# Patient Record
Sex: Male | Born: 1935 | Race: White | Hispanic: No | Marital: Married | Smoking: Current every day smoker
Health system: Southern US, Community
[De-identification: ages and names within clinical notes are randomized; demographics above are authoritative.]

## PROBLEM LIST (undated history)

## (undated) DIAGNOSIS — E785 Hyperlipidemia, unspecified: Secondary | ICD-10-CM

## (undated) DIAGNOSIS — C801 Malignant (primary) neoplasm, unspecified: Secondary | ICD-10-CM

## (undated) DIAGNOSIS — G912 (Idiopathic) normal pressure hydrocephalus: Secondary | ICD-10-CM

## (undated) DIAGNOSIS — G919 Hydrocephalus, unspecified: Secondary | ICD-10-CM

## (undated) DIAGNOSIS — N183 Chronic kidney disease, stage 3 unspecified: Secondary | ICD-10-CM

## (undated) DIAGNOSIS — R269 Unspecified abnormalities of gait and mobility: Secondary | ICD-10-CM

## (undated) DIAGNOSIS — R413 Other amnesia: Secondary | ICD-10-CM

## (undated) DIAGNOSIS — G43909 Migraine, unspecified, not intractable, without status migrainosus: Secondary | ICD-10-CM

## (undated) DIAGNOSIS — W19XXXA Unspecified fall, initial encounter: Secondary | ICD-10-CM

## (undated) DIAGNOSIS — G629 Polyneuropathy, unspecified: Secondary | ICD-10-CM

## (undated) DIAGNOSIS — I4719 Other supraventricular tachycardia: Secondary | ICD-10-CM

## (undated) DIAGNOSIS — R296 Repeated falls: Secondary | ICD-10-CM

## (undated) DIAGNOSIS — E039 Hypothyroidism, unspecified: Secondary | ICD-10-CM

## (undated) DIAGNOSIS — I471 Supraventricular tachycardia: Secondary | ICD-10-CM

## (undated) DIAGNOSIS — F0390 Unspecified dementia without behavioral disturbance: Secondary | ICD-10-CM

## (undated) DIAGNOSIS — I10 Essential (primary) hypertension: Secondary | ICD-10-CM

## (undated) DIAGNOSIS — Z8679 Personal history of other diseases of the circulatory system: Secondary | ICD-10-CM

## (undated) HISTORY — PX: CYSTOSTOMY W/ BLADDER BIOPSY: SHX1431

## (undated) HISTORY — PX: SHOULDER SURGERY: SHX246

## (undated) HISTORY — DX: (Idiopathic) normal pressure hydrocephalus: G91.2

## (undated) HISTORY — PX: CAROTID ARTERY ANGIOPLASTY: SHX1300

## (undated) HISTORY — DX: Migraine, unspecified, not intractable, without status migrainosus: G43.909

## (undated) HISTORY — DX: Hypothyroidism, unspecified: E03.9

## (undated) HISTORY — DX: Other amnesia: R41.3

## (undated) HISTORY — PX: CAROTID ENDARTERECTOMY: SUR193

## (undated) HISTORY — DX: Personal history of other diseases of the circulatory system: Z86.79

## (undated) HISTORY — DX: Hyperlipidemia, unspecified: E78.5

## (undated) HISTORY — PX: CATARACT EXTRACTION: SUR2

## (undated) HISTORY — PX: VENTRICULOPERITONEAL SHUNT: SHX204

## (undated) HISTORY — DX: Unspecified abnormalities of gait and mobility: R26.9

---

## 1998-01-08 ENCOUNTER — Encounter: Admission: RE | Admit: 1998-01-08 | Discharge: 1998-01-17 | Payer: Self-pay | Admitting: Neurology

## 1999-08-21 ENCOUNTER — Ambulatory Visit (HOSPITAL_COMMUNITY): Admission: RE | Admit: 1999-08-21 | Discharge: 1999-08-21 | Payer: Self-pay | Admitting: Family Medicine

## 1999-10-03 ENCOUNTER — Encounter: Payer: Self-pay | Admitting: *Deleted

## 1999-10-10 ENCOUNTER — Ambulatory Visit (HOSPITAL_COMMUNITY): Admission: RE | Admit: 1999-10-10 | Discharge: 1999-10-11 | Payer: Self-pay | Admitting: *Deleted

## 2000-05-08 ENCOUNTER — Encounter (INDEPENDENT_AMBULATORY_CARE_PROVIDER_SITE_OTHER): Payer: Self-pay | Admitting: *Deleted

## 2000-05-08 ENCOUNTER — Inpatient Hospital Stay (HOSPITAL_COMMUNITY): Admission: RE | Admit: 2000-05-08 | Discharge: 2000-05-09 | Payer: Self-pay | Admitting: *Deleted

## 2000-05-09 ENCOUNTER — Encounter: Payer: Self-pay | Admitting: *Deleted

## 2000-06-15 ENCOUNTER — Other Ambulatory Visit: Admission: RE | Admit: 2000-06-15 | Discharge: 2000-06-15 | Payer: Self-pay | Admitting: Urology

## 2000-06-15 ENCOUNTER — Encounter (INDEPENDENT_AMBULATORY_CARE_PROVIDER_SITE_OTHER): Payer: Self-pay | Admitting: Specialist

## 2000-06-18 ENCOUNTER — Ambulatory Visit: Admission: RE | Admit: 2000-06-18 | Discharge: 2000-09-16 | Payer: Self-pay | Admitting: Radiation Oncology

## 2000-07-14 ENCOUNTER — Ambulatory Visit (HOSPITAL_COMMUNITY): Admission: RE | Admit: 2000-07-14 | Discharge: 2000-07-14 | Payer: Self-pay | Admitting: Neurology

## 2000-07-22 ENCOUNTER — Encounter: Payer: Self-pay | Admitting: Family Medicine

## 2000-07-22 ENCOUNTER — Encounter: Admission: RE | Admit: 2000-07-22 | Discharge: 2000-07-22 | Payer: Self-pay | Admitting: Family Medicine

## 2000-09-18 ENCOUNTER — Ambulatory Visit: Admission: RE | Admit: 2000-09-18 | Discharge: 2000-12-17 | Payer: Self-pay | Admitting: Urology

## 2001-08-06 ENCOUNTER — Encounter: Payer: Self-pay | Admitting: Neurology

## 2001-08-06 ENCOUNTER — Ambulatory Visit (HOSPITAL_COMMUNITY): Admission: RE | Admit: 2001-08-06 | Discharge: 2001-08-06 | Payer: Self-pay | Admitting: Neurology

## 2001-11-29 ENCOUNTER — Ambulatory Visit (HOSPITAL_COMMUNITY): Admission: RE | Admit: 2001-11-29 | Discharge: 2001-11-29 | Payer: Self-pay | Admitting: *Deleted

## 2001-12-21 ENCOUNTER — Encounter: Payer: Self-pay | Admitting: Neurosurgery

## 2001-12-21 ENCOUNTER — Encounter: Admission: RE | Admit: 2001-12-21 | Discharge: 2001-12-21 | Payer: Self-pay | Admitting: Neurosurgery

## 2001-12-22 ENCOUNTER — Ambulatory Visit (HOSPITAL_COMMUNITY): Admission: RE | Admit: 2001-12-22 | Discharge: 2001-12-22 | Payer: Self-pay

## 2001-12-22 ENCOUNTER — Encounter: Payer: Self-pay | Admitting: Neurosurgery

## 2002-01-03 ENCOUNTER — Encounter: Payer: Self-pay | Admitting: Neurological Surgery

## 2002-01-04 ENCOUNTER — Encounter: Payer: Self-pay | Admitting: Neurological Surgery

## 2002-01-04 ENCOUNTER — Inpatient Hospital Stay (HOSPITAL_COMMUNITY): Admission: RE | Admit: 2002-01-04 | Discharge: 2002-01-06 | Payer: Self-pay | Admitting: Neurological Surgery

## 2002-01-05 ENCOUNTER — Encounter: Payer: Self-pay | Admitting: Neurological Surgery

## 2002-02-01 ENCOUNTER — Encounter: Payer: Self-pay | Admitting: Neurological Surgery

## 2002-02-01 ENCOUNTER — Ambulatory Visit (HOSPITAL_COMMUNITY): Admission: RE | Admit: 2002-02-01 | Discharge: 2002-02-01 | Payer: Self-pay | Admitting: Neurological Surgery

## 2002-03-29 ENCOUNTER — Encounter: Payer: Self-pay | Admitting: Neurological Surgery

## 2002-03-29 ENCOUNTER — Ambulatory Visit (HOSPITAL_COMMUNITY): Admission: RE | Admit: 2002-03-29 | Discharge: 2002-03-29 | Payer: Self-pay | Admitting: Neurological Surgery

## 2002-04-18 ENCOUNTER — Encounter: Payer: Self-pay | Admitting: Neurological Surgery

## 2002-04-18 ENCOUNTER — Encounter: Admission: RE | Admit: 2002-04-18 | Discharge: 2002-04-18 | Payer: Self-pay | Admitting: Neurological Surgery

## 2002-06-27 ENCOUNTER — Encounter: Payer: Self-pay | Admitting: Neurological Surgery

## 2002-06-27 ENCOUNTER — Ambulatory Visit (HOSPITAL_COMMUNITY): Admission: RE | Admit: 2002-06-27 | Discharge: 2002-06-27 | Payer: Self-pay | Admitting: Neurological Surgery

## 2002-10-26 ENCOUNTER — Encounter: Admission: RE | Admit: 2002-10-26 | Discharge: 2002-10-26 | Payer: Self-pay | Admitting: Neurological Surgery

## 2002-10-26 ENCOUNTER — Encounter: Payer: Self-pay | Admitting: Neurological Surgery

## 2002-12-28 ENCOUNTER — Emergency Department (HOSPITAL_COMMUNITY): Admission: EM | Admit: 2002-12-28 | Discharge: 2002-12-28 | Payer: Self-pay | Admitting: Emergency Medicine

## 2002-12-30 ENCOUNTER — Emergency Department (HOSPITAL_COMMUNITY): Admission: EM | Admit: 2002-12-30 | Discharge: 2002-12-30 | Payer: Self-pay | Admitting: Emergency Medicine

## 2003-01-25 ENCOUNTER — Encounter: Payer: Self-pay | Admitting: Cardiovascular Disease

## 2003-01-25 ENCOUNTER — Inpatient Hospital Stay (HOSPITAL_COMMUNITY): Admission: EM | Admit: 2003-01-25 | Discharge: 2003-01-27 | Payer: Self-pay | Admitting: Emergency Medicine

## 2003-07-21 ENCOUNTER — Ambulatory Visit (HOSPITAL_COMMUNITY): Admission: RE | Admit: 2003-07-21 | Discharge: 2003-07-21 | Payer: Self-pay | Admitting: Internal Medicine

## 2003-07-21 ENCOUNTER — Encounter: Admission: RE | Admit: 2003-07-21 | Discharge: 2003-07-21 | Payer: Self-pay | Admitting: Family Medicine

## 2003-08-25 ENCOUNTER — Ambulatory Visit (HOSPITAL_COMMUNITY): Admission: RE | Admit: 2003-08-25 | Discharge: 2003-08-25 | Payer: Self-pay | Admitting: Neurological Surgery

## 2003-09-20 ENCOUNTER — Encounter: Admission: RE | Admit: 2003-09-20 | Discharge: 2003-09-20 | Payer: Self-pay | Admitting: Neurological Surgery

## 2004-01-23 ENCOUNTER — Encounter: Admission: RE | Admit: 2004-01-23 | Discharge: 2004-01-23 | Payer: Self-pay | Admitting: Neurological Surgery

## 2004-02-28 ENCOUNTER — Encounter: Admission: RE | Admit: 2004-02-28 | Discharge: 2004-02-28 | Payer: Self-pay | Admitting: Neurological Surgery

## 2004-03-27 ENCOUNTER — Ambulatory Visit (HOSPITAL_COMMUNITY): Admission: RE | Admit: 2004-03-27 | Discharge: 2004-03-27 | Payer: Self-pay | Admitting: Neurological Surgery

## 2004-06-28 ENCOUNTER — Encounter: Admission: RE | Admit: 2004-06-28 | Discharge: 2004-06-28 | Payer: Self-pay | Admitting: Neurological Surgery

## 2004-07-11 ENCOUNTER — Ambulatory Visit (HOSPITAL_COMMUNITY): Admission: RE | Admit: 2004-07-11 | Discharge: 2004-07-11 | Payer: Self-pay | Admitting: Neurological Surgery

## 2005-09-15 IMAGING — CT CT HEAD W/O CM
1 series · 15 of 30 positions shown, 19 images · non-contrast
Comparison: Previous study from May 2002.

CLINICAL DATA: 69-year-old, with dizziness.  History of falls.
TECHNIQUE: Standard head CT was performed.

[Series 2: brain · axial · 0.45mm/px · z∈[+11,+137]mm · 15 of 32 slices shown, 19 images]
[im 2/32  brain]
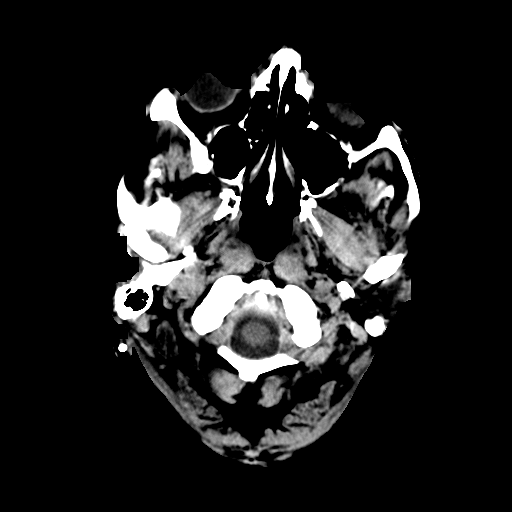
[im 2/32  bone]
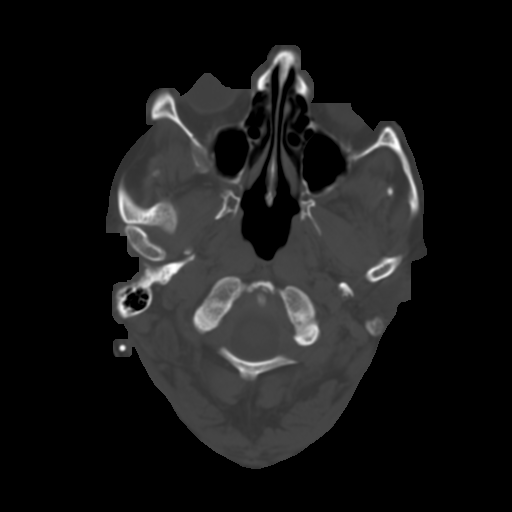
[im 4/32  brain]
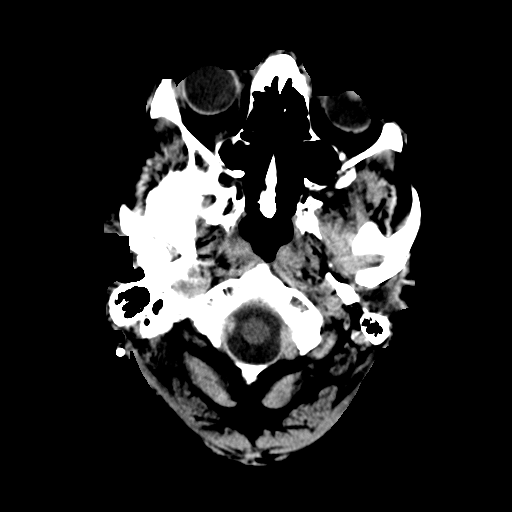
[im 6/32  brain]
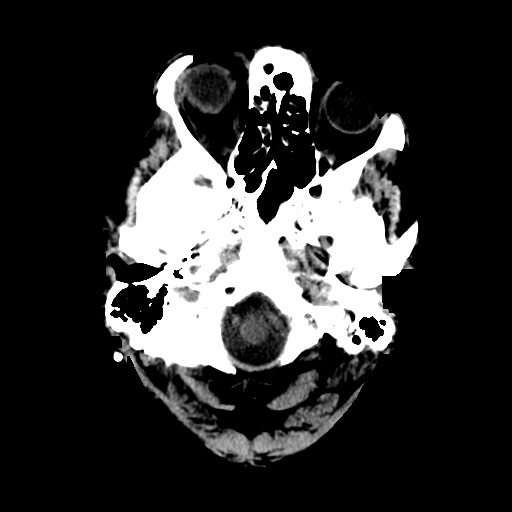
[im 8/32  brain]
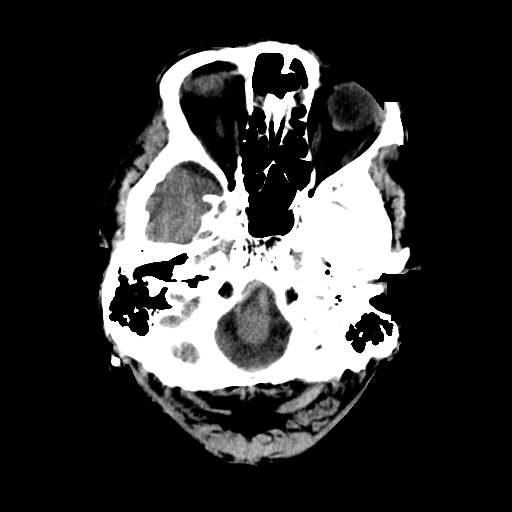
[im 10/32  brain]
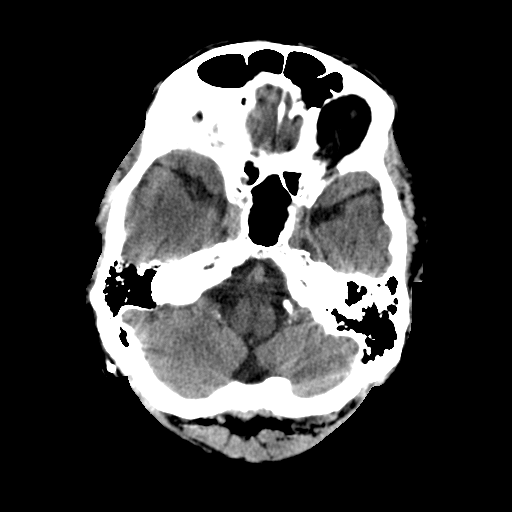
[im 10/32  bone]
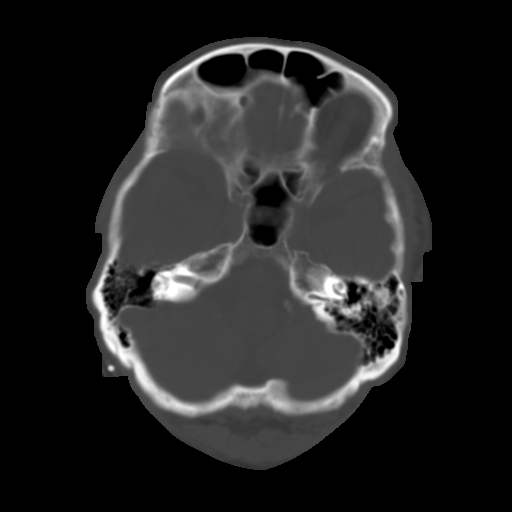
[im 12/32  brain]
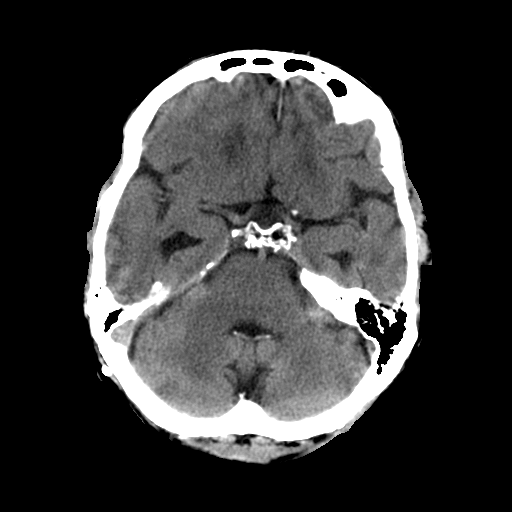
[im 14/32  brain]
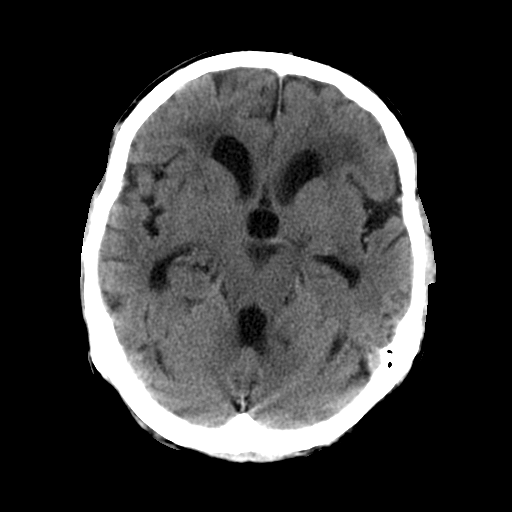
[im 17/32  brain]
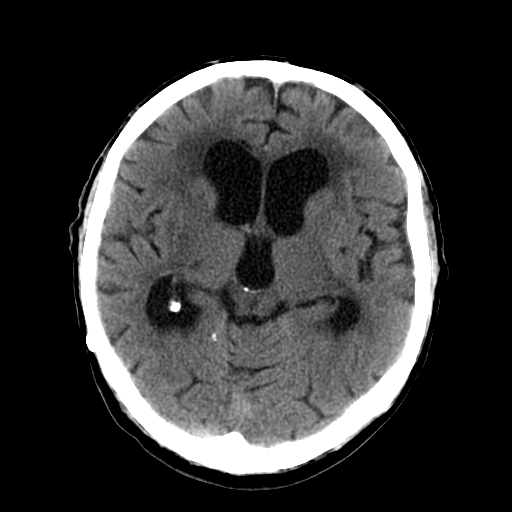
[im 18/32  brain]
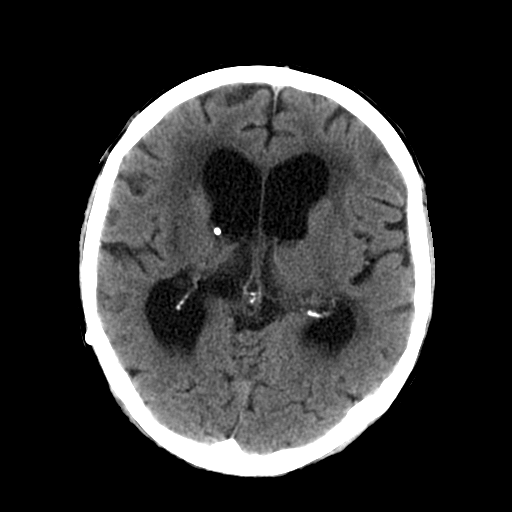
[im 18/32  bone]
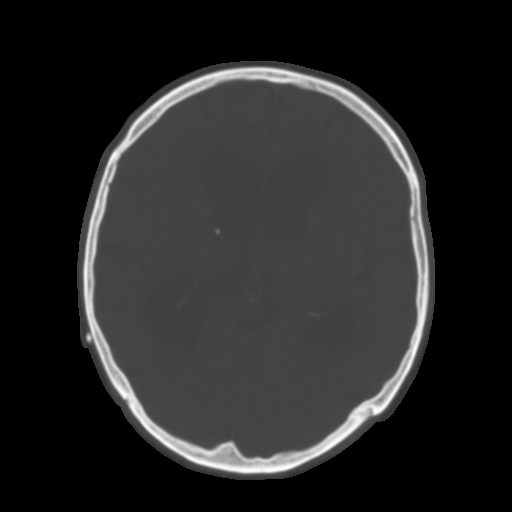
[im 20/32  brain]
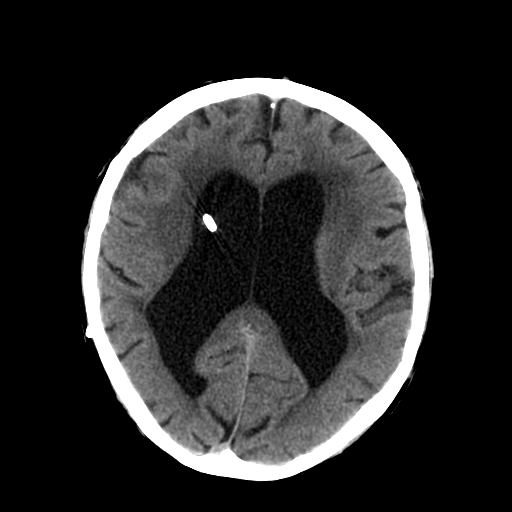
[im 22/32  brain]
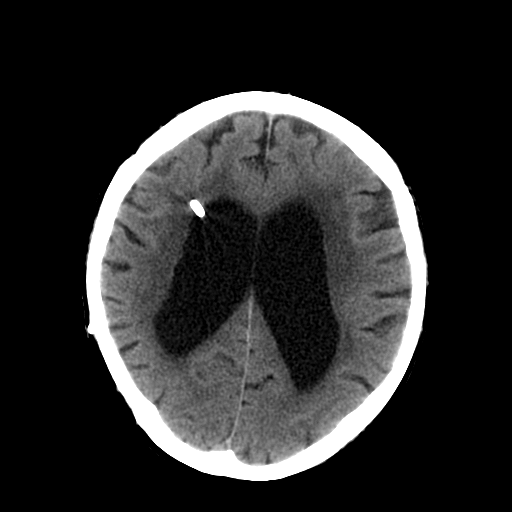
[im 24/32  brain]
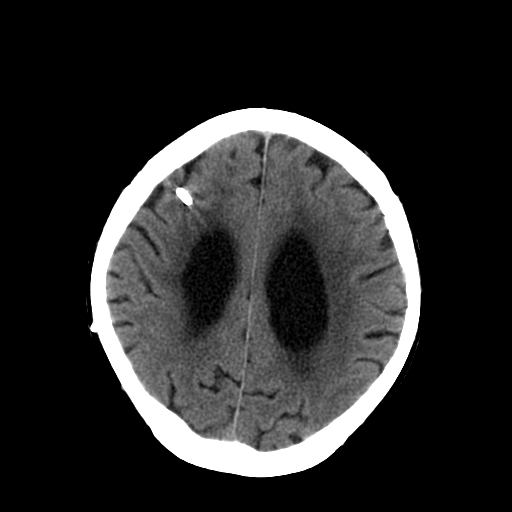
[im 26/32  brain]
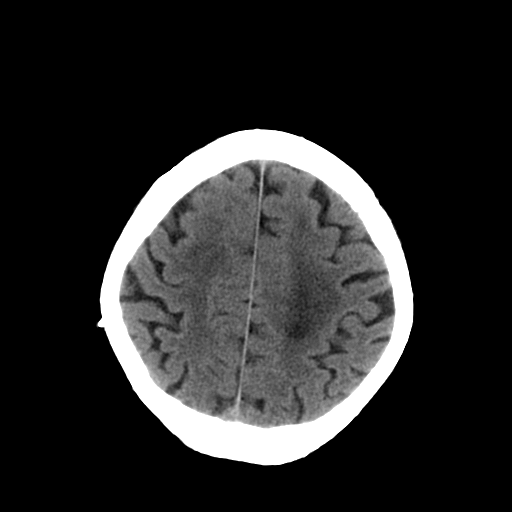
[im 26/32  bone]
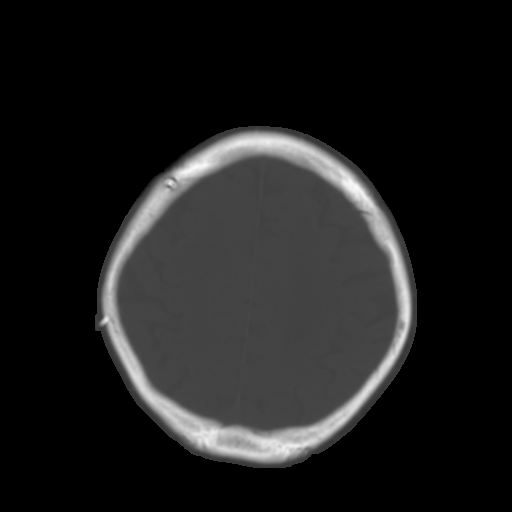
[im 28/32  brain]
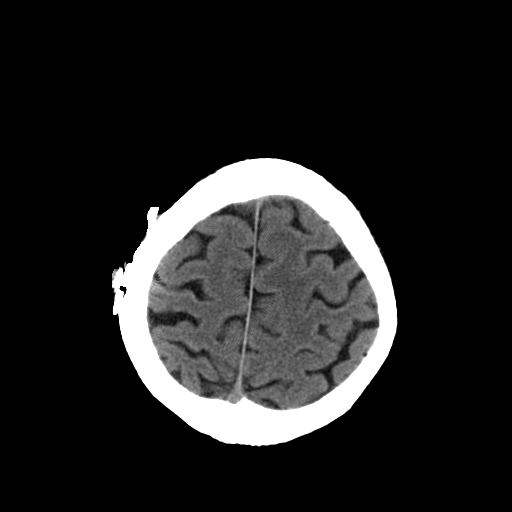
[im 30/32  brain]
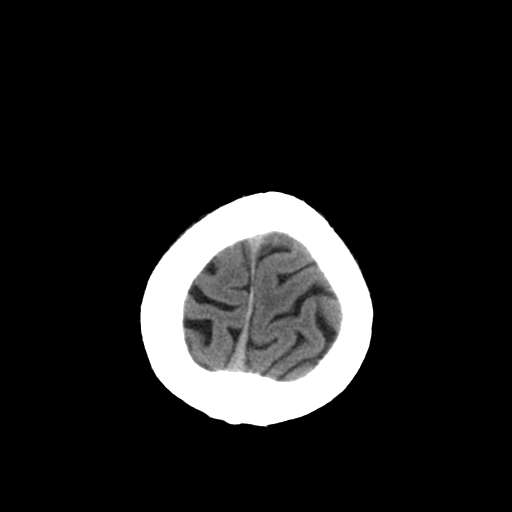

[15 of 30 positions shown; findings below may reference images not displayed]

NONCONTRAST CRANIAL CT:
 Intracranially, the ventricles are enlarged but are in the midline without mass effect or shift and are stable when compared to the prior examination.  There is a ventriculostomy shunt catheter which is unchanged in position.  There is periventricular white matter disease which is stable.  No extraaxial fluid collections are seen.  No CT evidence for acute intracranial abnormality and no intracranial mass lesions.  Remote lacunar-type infarct is noted in the left basal ganglia region.  The brainstem and cerebellum are grossly normal. 
 The bony calvarium is intact.  I do not see any obvious discontinuity of the ventriculoperitoneal shunt catheter.  The visualized paranasal sinuses and mastoid air cells are clear.
IMPRESSION: 1.  Stable appearance and position of the ventriculoperitoneal shunt catheter.  There is persistent ventriculomegaly.  
 2.  Stable periventricular white matter disease and remote lacunar-type infarcts. 
 3.  No acute intracranial findings and no interval change since patient?s prior head CT from October 2002.

## 2005-09-19 ENCOUNTER — Encounter: Admission: RE | Admit: 2005-09-19 | Discharge: 2005-09-19 | Payer: Self-pay | Admitting: Neurological Surgery

## 2006-05-22 ENCOUNTER — Encounter: Admission: RE | Admit: 2006-05-22 | Discharge: 2006-05-22 | Payer: Self-pay | Admitting: Neurological Surgery

## 2006-10-15 ENCOUNTER — Ambulatory Visit: Payer: Self-pay | Admitting: *Deleted

## 2007-03-25 ENCOUNTER — Ambulatory Visit: Payer: Self-pay | Admitting: Surgery

## 2007-03-25 ENCOUNTER — Ambulatory Visit: Payer: Self-pay | Admitting: *Deleted

## 2007-03-25 ENCOUNTER — Encounter: Admission: RE | Admit: 2007-03-25 | Discharge: 2007-03-25 | Payer: Self-pay | Admitting: Otolaryngology

## 2007-04-20 ENCOUNTER — Encounter: Admission: RE | Admit: 2007-04-20 | Discharge: 2007-05-13 | Payer: Self-pay | Admitting: Neurology

## 2007-10-07 ENCOUNTER — Ambulatory Visit: Payer: Self-pay | Admitting: *Deleted

## 2008-01-10 ENCOUNTER — Encounter: Admission: RE | Admit: 2008-01-10 | Discharge: 2008-01-10 | Payer: Self-pay | Admitting: Family Medicine

## 2008-04-03 ENCOUNTER — Encounter (INDEPENDENT_AMBULATORY_CARE_PROVIDER_SITE_OTHER): Payer: Self-pay | Admitting: Urology

## 2008-04-03 ENCOUNTER — Ambulatory Visit (HOSPITAL_BASED_OUTPATIENT_CLINIC_OR_DEPARTMENT_OTHER): Admission: RE | Admit: 2008-04-03 | Discharge: 2008-04-03 | Payer: Self-pay | Admitting: Urology

## 2008-04-04 ENCOUNTER — Emergency Department (HOSPITAL_COMMUNITY): Admission: EM | Admit: 2008-04-04 | Discharge: 2008-04-04 | Payer: Self-pay | Admitting: Emergency Medicine

## 2008-04-06 ENCOUNTER — Emergency Department (HOSPITAL_COMMUNITY): Admission: EM | Admit: 2008-04-06 | Discharge: 2008-04-06 | Payer: Self-pay | Admitting: Emergency Medicine

## 2008-05-01 ENCOUNTER — Emergency Department (HOSPITAL_COMMUNITY): Admission: EM | Admit: 2008-05-01 | Discharge: 2008-05-02 | Payer: Self-pay | Admitting: Emergency Medicine

## 2008-05-02 ENCOUNTER — Emergency Department (HOSPITAL_COMMUNITY): Admission: EM | Admit: 2008-05-02 | Discharge: 2008-05-02 | Payer: Self-pay | Admitting: Emergency Medicine

## 2008-05-05 ENCOUNTER — Encounter: Admission: RE | Admit: 2008-05-05 | Discharge: 2008-05-05 | Payer: Self-pay | Admitting: Neurology

## 2008-08-23 ENCOUNTER — Encounter: Admission: RE | Admit: 2008-08-23 | Discharge: 2008-08-23 | Payer: Self-pay | Admitting: Neurological Surgery

## 2008-08-30 ENCOUNTER — Encounter: Admission: RE | Admit: 2008-08-30 | Discharge: 2008-08-30 | Payer: Self-pay | Admitting: Neurosurgery

## 2008-09-14 ENCOUNTER — Encounter: Admission: RE | Admit: 2008-09-14 | Discharge: 2008-09-14 | Payer: Self-pay | Admitting: Neurological Surgery

## 2008-10-06 ENCOUNTER — Encounter: Admission: RE | Admit: 2008-10-06 | Discharge: 2008-10-06 | Payer: Self-pay | Admitting: Neurological Surgery

## 2008-11-17 ENCOUNTER — Encounter: Admission: RE | Admit: 2008-11-17 | Discharge: 2008-11-17 | Payer: Self-pay | Admitting: Neurological Surgery

## 2009-01-22 ENCOUNTER — Encounter: Admission: RE | Admit: 2009-01-22 | Discharge: 2009-01-22 | Payer: Self-pay | Admitting: Neurological Surgery

## 2009-10-29 ENCOUNTER — Encounter: Admission: RE | Admit: 2009-10-29 | Discharge: 2009-10-29 | Payer: Self-pay | Admitting: Family Medicine

## 2010-01-27 ENCOUNTER — Encounter: Payer: Self-pay | Admitting: Neurological Surgery

## 2010-01-27 ENCOUNTER — Encounter: Payer: Self-pay | Admitting: Family Medicine

## 2010-02-28 ENCOUNTER — Other Ambulatory Visit: Payer: Self-pay | Admitting: Family Medicine

## 2010-02-28 ENCOUNTER — Ambulatory Visit
Admission: RE | Admit: 2010-02-28 | Discharge: 2010-02-28 | Disposition: A | Discharge status: Home / Self Care | Payer: Medicare Other | Source: Ambulatory Visit | Attending: Family Medicine | Admitting: Family Medicine

## 2010-03-19 ENCOUNTER — Emergency Department (HOSPITAL_COMMUNITY)
Admission: EM | Admit: 2010-03-19 | Discharge: 2010-03-19 | Disposition: A | Discharge status: Home / Self Care | Payer: Medicare Other | Attending: Emergency Medicine | Admitting: Emergency Medicine

## 2010-03-19 ENCOUNTER — Emergency Department (HOSPITAL_COMMUNITY): Payer: Medicare Other

## 2010-03-19 DIAGNOSIS — R4789 Other speech disturbances: Secondary | ICD-10-CM | POA: Insufficient documentation

## 2010-03-19 DIAGNOSIS — G9389 Other specified disorders of brain: Secondary | ICD-10-CM | POA: Insufficient documentation

## 2010-03-19 DIAGNOSIS — I10 Essential (primary) hypertension: Secondary | ICD-10-CM | POA: Insufficient documentation

## 2010-03-19 DIAGNOSIS — R51 Headache: Secondary | ICD-10-CM | POA: Insufficient documentation

## 2010-03-19 LAB — BASIC METABOLIC PANEL
BUN: 16 mg/dL (ref 6–23)
Chloride: 100 mEq/L (ref 96–112)
GFR calc Af Amer: 43 mL/min — ABNORMAL LOW (ref >60)
GFR calc non Af Amer: 36 mL/min — ABNORMAL LOW (ref >60)
Potassium: 3.7 mEq/L (ref 3.5–5.1)
Sodium: 133 mEq/L — ABNORMAL LOW (ref 135–145)

## 2010-03-19 LAB — URINALYSIS, ROUTINE W REFLEX MICROSCOPIC
Glucose, UA: NEGATIVE mg/dL
Ketones, ur: NEGATIVE mg/dL
Nitrite: NEGATIVE
Specific Gravity, Urine: 1.008 (ref 1.005–1.030)
pH: 7.5 (ref 5.0–8.0)

## 2010-03-19 LAB — CBC
MCV: 88.5 fL (ref 78.0–100.0)
Platelets: 280 10*3/uL (ref 150–400)
RBC: 4.35 MIL/uL (ref 4.22–5.81)
RDW: 12.2 % (ref 11.5–15.5)
WBC: 6.8 10*3/uL (ref 4.0–10.5)

## 2010-04-17 LAB — DIFFERENTIAL
Basophils Absolute: 0 10*3/uL (ref 0.0–0.1)
Basophils Relative: 0 % (ref 0–1)
Eosinophils Absolute: 0.1 10*3/uL (ref 0.0–0.7)
Eosinophils Relative: 3 % (ref 0–5)
Lymphocytes Relative: 22 % (ref 12–46)
Lymphs Abs: 2.1 10*3/uL (ref 0.7–4.0)
Monocytes Absolute: 0.6 10*3/uL (ref 0.1–1.0)
Monocytes Absolute: 1 10*3/uL (ref 0.1–1.0)
Monocytes Relative: 7 % (ref 3–12)
Monocytes Relative: 10 % (ref 3–12)
Neutrophils Relative %: 78 % — ABNORMAL HIGH (ref 43–77)

## 2010-04-17 LAB — URINALYSIS, ROUTINE W REFLEX MICROSCOPIC
Bilirubin Urine: NEGATIVE
Glucose, UA: NEGATIVE mg/dL
Glucose, UA: NEGATIVE mg/dL
Ketones, ur: NEGATIVE mg/dL
Ketones, ur: NEGATIVE mg/dL
Nitrite: NEGATIVE
pH: 6 (ref 5.0–8.0)
pH: 6 (ref 5.0–8.0)

## 2010-04-17 LAB — COMPREHENSIVE METABOLIC PANEL
ALT: 20 U/L (ref 0–53)
AST: 24 U/L (ref 0–37)
Albumin: 3.6 g/dL (ref 3.5–5.2)
Albumin: 3.6 g/dL (ref 3.5–5.2)
Alkaline Phosphatase: 92 U/L (ref 39–117)
Calcium: 9.2 mg/dL (ref 8.4–10.5)
Chloride: 99 mEq/L (ref 96–112)
Creatinine, Ser: 1.88 mg/dL — ABNORMAL HIGH (ref 0.4–1.5)
GFR calc Af Amer: 43 mL/min — ABNORMAL LOW (ref >60)
GFR calc non Af Amer: 36 mL/min — ABNORMAL LOW (ref >60)
Glucose, Bld: 109 mg/dL — ABNORMAL HIGH (ref 70–99)
Potassium: 4.6 mEq/L (ref 3.5–5.1)
Sodium: 133 mEq/L — ABNORMAL LOW (ref 135–145)
Total Bilirubin: 1.3 mg/dL — ABNORMAL HIGH (ref 0.3–1.2)
Total Protein: 7.4 g/dL (ref 6.0–8.3)
Total Protein: 7.1 g/dL (ref 6.0–8.3)

## 2010-04-17 LAB — CBC
HCT: 38.6 % — ABNORMAL LOW (ref 39.0–52.0)
Hemoglobin: 13.2 g/dL (ref 13.0–17.0)
MCHC: 34.3 g/dL (ref 30.0–36.0)
MCV: 93.1 fL (ref 78.0–100.0)
Platelets: 292 10*3/uL (ref 150–400)
RDW: 12.2 % (ref 11.5–15.5)
WBC: 8.4 10*3/uL (ref 4.0–10.5)

## 2010-04-17 LAB — URINE MICROSCOPIC-ADD ON

## 2010-04-17 LAB — URINE CULTURE

## 2010-04-18 LAB — POCT I-STAT 4, (NA,K, GLUC, HGB,HCT)
Glucose, Bld: 95 mg/dL (ref 70–99)
Hemoglobin: 16 g/dL (ref 13.0–17.0)
Potassium: 4.1 mEq/L (ref 3.5–5.1)
Sodium: 140 mEq/L (ref 135–145)

## 2010-05-21 NOTE — Procedures (Signed)
CAROTID DUPLEX EXAM   INDICATION:  Follow-up evaluation of known carotid artery disease.   HISTORY:  Diabetes:  No.  Cardiac:  No.  Hypertension:  Yes.  Smoking:  Yes.  Previous Surgery:  Left carotid end arterectomy on Dubin 3, 2002 by Dr.  Madilyn Fireman.  CV History:  Patient reports no cerebrovascular symptoms at this time.  Amaurosis Fugax No, Paresthesias No, Hemiparesis No                                       RIGHT             LEFT  Brachial systolic pressure:         136               132  Brachial Doppler waveforms:         Triphasic         Biphasic  Vertebral direction of flow:        Antegrade         Retrograde  DUPLEX VELOCITIES (cm/sec)  CCA peak systolic                   83                113  ECA peak systolic                   194               83  ICA peak systolic                   65                71  ICA end diastolic                   19                26  PLAQUE MORPHOLOGY:                  Soft              Soft  PLAQUE AMOUNT:                      Mild              Mild  PLAQUE LOCATION:                    Proximal ICA      Proximal ICA   IMPRESSION:  1. 20-39% right internal carotid artery stenosis.  2. 20-39% left internal carotid artery stenosis.  3. Left subclavian steal syndrome.   ___________________________________________  P. Liliane Bade, M.D.   MC/MEDQ  D:  10/15/2006  T:  10/16/2006  Job:  [id number]

## 2010-05-21 NOTE — Procedures (Signed)
CAROTID DUPLEX EXAM   INDICATION:  Followup left carotid endarterectomy.   HISTORY:  Diabetes:  No.  Cardiac:  No.  Hypertension:  Yes.  Smoking:  Yes.  Previous Surgery:  Left carotid endarterectomy on 05/08/2000.  CV History:  Amaurosis Fugax No, Paresthesias No, Hemiparesis No                                       RIGHT             LEFT  Brachial systolic pressure:         154               130  Brachial Doppler waveforms:         Normal            Biphasic  Vertebral direction of flow:        Antegrade         Retrograde  DUPLEX VELOCITIES (cm/sec)  CCA peak systolic                   82                133  ECA peak systolic                   202               144  ICA peak systolic                   66                50  ICA end diastolic                   23                12  PLAQUE MORPHOLOGY:                  Heterogenous      Soft  PLAQUE AMOUNT:                      Mild              Minimal  PLAQUE LOCATION:                    ICA               ICA   IMPRESSION:  1. Patent left carotid endarterectomy site with a 1-39% stenoses of      the bilateral internal carotid arteries noted.  2. Left subclavian steal syndrome is noted.  3. No significant change when compared to the previous exam on      10/15/2006.   ___________________________________________  P. Liliane Bade, M.D.   CH/MEDQ  D:  10/07/2007  T:  10/07/2007  Job:  [id number]

## 2010-05-21 NOTE — Assessment & Plan Note (Signed)
OFFICE VISIT   KEKOA, FYOCK  DOB:  [birth year]                                       03/25/2007  UUVOZ#:[id number]   Patient is a longstanding patient of mine with a history of left carotid  endarterectomy carried out in 2002 along with left iliac angioplasty and  stenting in 2001.   He notes at this time his feet to be cold.  They turn blue at times.  He  has numbness in his feet.  Chronic foot pain bilaterally.  He has noted  an increase in these symptoms over the past 6-8 weeks.   His past medical history includes hypothyroidism, peripheral neuropathy,  hypertension, tobacco abuse, gastroesophageal reflux, cardiovascular  disease, and peripheral vascular disease.   He continues to smoke about a half pack of cigarettes daily.   Since last seen by me, he has had prostate cancer treated with  radiotherapy and also cataract surgery.   CURRENT MEDICATIONS:  1. Prilosec 20 mg b.i.d.  2. Zestril 20 mg daily.  3. Cymbalta 60 mg daily.  4. Detrol LA 4 mg daily.  5. Metanx 1 tablet b.i.d.  6. Synthroid 0.12 mcg daily.  7. Lyrica 75 mg t.i.d.  8. Fish oil 1000 mg t.i.d.  9. Hydrocodone 5/500 1/2 tablet p.r.n.  10.Amitriptyline 25 mg nightly.  11.Multivitamin 1 tablet daily.   He has no known drug allergies.   PHYSICAL EXAMINATION:  Patient is a 76 year old gentleman who appears  approximately his stated age.  He is alert and oriented.  No acute  distress.  BP 114/78, pulse 88 per minute, respirations 18 per minute,  O2 sat 99%.  Lower extremity evaluation revealed intact femoral,  popliteal pulses bilaterally.  Dorsalis pedis and posterior tibial pulse  1+ bilaterally.  Superficial varicosities noted bilaterally.  No ankle  edema.  No skin ulceration.   Lower extremity Dopplers were carried out today, and these reveal  excellent lower extremity flow with ankle brachial indices greater than  1 in the right leg with triphasic arterial waveforms and  0.92 in the  left leg with triphasic arterial waveforms.  These remain quite stable.  No significant peripheral vascular disease identified by Doppler.   Patient has stable lower extremity circulation.  This is really in quite  good shape at this time.  Well-maintained ankle brachial indices with  excellent lower extremity flow.  He was informed of these results, and  no further investigation is planned at this time.   Balinda Quails, M.D.  Electronically Signed   PGH/MEDQ  D:  03/25/2007  T:  03/26/2007  Job:  [id number]   cc:   Duncan Dull, M.D.  Evie Lacks, MD

## 2010-05-21 NOTE — Procedures (Signed)
LOWER EXTREMITY ARTERIAL EVALUATION-SINGLE LEVEL   INDICATION:  Follow up peripheral vascular disease.   HISTORY:  Diabetes:  No.  Cardiac:  Hypertension:  Yes.  Smoking:  Yes, 1 pack every two days.  Previous Surgery:  Left common iliac artery PTA and stent on 10/10/1999  by Dr. Madilyn Fireman.   RESTING SYSTOLIC PRESSURES: (ABI)                          RIGHT                LEFT  Brachial:               130 (triphasic)      100 (monophasic)  Anterior tibial:        122                  106  Posterior tibial:       140 (>1.0)           120 (0.92)  Peroneal:  DOPPLER WAVEFORM ANALYSIS:  Anterior tibial:        Triphasic            Triphasic                   Posterior tibial: Triphasic   Triphasic     Peroneal:   PREVIOUS ABI'S:  Date: 10/15/2006  RIGHT:  1.0  LEFT:  0.86   IMPRESSION:  1. ABIs are stable bilaterally.  2. Known left subclavian artery stenosis.  3. Right great toe pressure 90mm Hg.  4. Left great toe pressure 80mm Hg.     ___________________________________________  P. Liliane Bade, M.D.   DP/MEDQ  D:  03/25/2007  T:  03/25/2007  Job:  [id number]

## 2010-05-21 NOTE — Op Note (Signed)
NAME:  Walter Carey, Walter Carey NO.:  [id number]   MEDICAL RECORD NO.:  [id number]          PATIENT TYPE:  AMB   LOCATION:  [facility]                         FACILITY:  [facility]   PHYSICIAN:  Heloise Purpura, MD      DATE OF BIRTH:  [birth year]   DATE OF PROCEDURE:  04/03/2008  DATE OF DISCHARGE:                               OPERATIVE REPORT   PREOPERATIVE DIAGNOSES:  1. Prostate cancer status post treatment with external beam radiation      therapy.  2. Gross hematuria.   POSTOPERATIVE DIAGNOSES:  1. Prostate cancer status post treatment with external beam radiation      therapy.  2. Gross hematuria.   PROCEDURES:  1. Cystoscopy.  2. Saline bladder washing.  3. Bilateral retrograde pyelography.  4. Bladder biopsy with fulguration.   SURGEON:  Heloise Purpura, MD.   ANESTHESIA:  General.   COMPLICATIONS:  None.   ESTIMATED BLOOD LOSS:  Minimal.   SPECIMENS:  1. Right lateral bladder biopsies.  2. Biopsies of bladder trigone.  3. Saline bladder washing for cytology.   DISPOSITION OF SPECIMENS:  To pathology   INDICATIONS:  Mr. Hench is a 76 year old gentleman with a history of  prostate cancer status post 3-D conformal external beam radiation  therapy in 2003.  He has been free of cancer recurrence.  However,  recently he was noted to have microscopic hematuria on a routine cancer  surveillance visit.  He subsequently developed gross hematuria and was  recommended to undergo an evaluation.  He did have a renal ultrasound  which did not demonstrate any obvious renal parenchymal lesions that  were concerning for renal malignancy.  He was scheduled to undergo a CT  scan and cystoscopy in the office but was found to have an elevated  creatinine of 1.8 precluding the administration of IV contrast.  We  discussed options and he elected to proceed with cystoscopy and  retrograde pyelography with diagnostic procedures as necessary in the  operating room.  The potential risks,  complications, and alternative  treatment options associated with the above procedures were discussed in  detail and informed consent was obtained.   DESCRIPTION OF PROCEDURE:  The patient was taken to the operating room  and a general anesthetic was administered.  He was given preoperative  antibiotics, placed in the dorsal lithotomy position, prepped and draped  in usual sterile fashion.  Next a preoperative time-out was performed.  Cystourethroscopy was then performed.  The anterior urethra was normal  in appearance.  The posterior urethra did demonstrate some  hypervascularity.  Examination of the bladder demonstrated  hypervascularity along the bladder trigone and particularly along the  right lateral bladder wall.  No clear bladder tumors were seen and these  sites were felt to be more related to radiation changes rather than  carcinoma in situ.  The remainder of the bladder was examined and the  ureteral orifices were noted to be in the normal anatomic position and  there were no other obvious mucosal lesions throughout the bladder.  Attention then returned to the bladder  and a saline bladder washing was  obtained for cytology.  Attention then turned to the left ureteral  orifice which was intubated with a 5-French ureteral catheter.  Omnipaque contrast was injected and a retrograde pyelogram was  performed.  No evidence of any filling defects in the renal collecting  system or ureter were identified and no abnormal dilation of the upper  urinary tract was identified.  An identical procedure was then performed  on the contralateral side.  Again, no filling defects or dilation was  noted in either the renal collecting system or ureter.  Attention then  returned to the bladder.  Cold cup biopsy forceps were used to take  biopsies of the lesions previously mentioned at the trigone and right  lateral aspects of the bladder.  The Bugbee electrode was used then to  fulgurate these  lesions.  Care was taken to avoid the ureteral orifices.  The bladder was emptied and the procedure was then ended.  The patient  tolerated the procedure well and without complications.  He was able to  be awakened and transferred to recovery unit in satisfactory condition.      Heloise Purpura, MD  Electronically Signed     LB/MEDQ  D:  04/03/2008  T:  04/03/2008  Job:  [id number]

## 2010-05-21 NOTE — Assessment & Plan Note (Signed)
OFFICE VISIT   ORMOND, LAZO  DOB:  January 03, 1935                                       10/07/2007  ZOXWR#:[id number]   The patient has been a longstanding patient of this practice.  He had a  left carotid endarterectomy in 2002.  Left iliac angioplasty with  stenting in 2001.   He comes in complaining of symptoms of increasing claudication in the  left lower extremity.  He states he does have discomfort in the lower  leg with ambulation.  No night pain or rest pain.  No history of  nonhealing ulceration.   His carotid duplex exam is unremarkable with widely patent  endarterectomy sites.   Lower extremity Doppler reveals a right ABI 1.0 and left ABI 0.81.  Biphasic arterial waveforms are noted in the tibial vessels bilaterally.   Evaluation reveals a moderately frail-appearing 76 year old gentleman.  Alert and oriented.  No acute distress.  Ambulates with a cane.  BP is  115/88, pulse 88 per minute.  Lower extremity exam reveals intact  femoral pulses bilaterally.  1+ dorsalis pedis and posterior tibial  pulse in the right foot.  Absent in the left foot.  No ischemic skin  changes.   The patient has evidence of mild reduction of left ankle brachial index.  No evidence of acute ischemia.  Symptoms are questionable for true  claudication.  Would not recommend at this time any further workup or  intervention.   Balinda Quails, M.D.  Electronically Signed   PGH/MEDQ  D:  10/07/2007  T:  10/08/2007  Job:  [id number]   cc:   Genene Churn. Love, M.D.  Duncan Dull, M.D.

## 2010-05-24 NOTE — Op Note (Signed)
NAME:  Walter Carey, Walter Carey NO.:  [id number]   MEDICAL RECORD NO.:  [id number]                   PATIENT TYPE:  INP   LOCATION:  3011                                 FACILITY:  [facility]   PHYSICIAN:  Stefani Dama, M.D.               DATE OF BIRTH:  08-Dec-1934   DATE OF PROCEDURE:  01/04/2002  DATE OF DISCHARGE:                                 OPERATIVE REPORT   PREOPERATIVE DIAGNOSIS:  Normal pressure hydrocephalus.   POSTOPERATIVE DIAGNOSIS:  Normal pressure hydrocephalus.   PROCEDURE:  Placement of right frontal ventriculoperitoneal shunt with  Codman-Hakin programmable shunt system.   SURGEON:  Stefani Dama, M.D.   FIRST ASSISTANT:  Cristi Loron, M.D.   ANESTHESIA:  General endotracheal.   INDICATIONS:  The patient is a 76 year old individual who has had  significant signs of ventricular enlargement for a number of years.  He had  progressive deterioration of his gait.  He is being taken to the operating  room to undergo shunt placement.   DESCRIPTION OF PROCEDURE:  The patient was brought to the operating room and  placed on the table in the supine position.  After the smooth induction of  general endotracheal anesthesia, his right fundal scalp was shaved, prepped  with Dura-Prep and draped in a sterile fashion as was the posterior portion  of the neck on the right side, the anterior chest wall and the right upper  quadrant.  With these areas being draped out, then the transverse incision  was made in the right upper quadrant measuring approximately 2-1/2 inches.  This was carried down through the subcutaneous tissue.  The lateral border  of the rectus sheath of the rectus sheath was identified and the anterior  border was opened and then the rectus muscle was dissected bluntly.  The  posterior rectus sheath was identified.  It was opened and then underlying  this the peritoneum was identified.  It was picked up carefully with a  hemostat and then opened and the peritoneal contents were identified with  some omental fat immediately in the wound.  A Penfield 4 instrument could be  easily slipped into the peritoneal cavity.  Then a vertical incision was  made 2 cm lateral to the midline at the coronal suture on the right side.  The galea was identified and stripped from the periosteum of the skull and  then a subcutaneous passage was made from this incision to the posterior  aspect of the neck on the right side where a small transverse incision was  created there and catheter was brought out.;  Then a shunt catheter was  passed from the inferior neck incision to the anterior abdominal wall  incision.  This was a sheaved shunt passer and the shunt passer was then  removed and placed into the sheath in the reverse position.  A 0 silk suture  was passed in the subcutaneous tunnel from the posterior neck incision to  the cranial incision.  This was connected to the distal shunt catheter, and  the shunt catheter was then fed into the subcutaneous tunnel from cranial to  abdominal and with the sheath ultimately being removed at the abdominal  incision.  The distal catheter was thus passed subcutaneously.  The shunt  valve was then protected with a soaking wet gauze sponge and a bur hole was  placed in the right frontal region with a high speed air drill and a 2.8 mm  dissecting tool.  The dura was identified.  It was opened in a vertical  incision with a 15 blade.  A ventricular shunt catheter was then placed to a  depth of 5 cm and immediately spinal fluid was obtained under modest  pressure coming out of the top of the catheter, listed just directly above  the patient's head suggesting that the pressure was greater than 15 cm of  water.  The system was shot at the 7 cm mark.  The catheter was trimmed and  connected to the distal shunting system.  The right angle connector was  placed around the shunt catheter and the  system was buried at a depth of  approximately 6 cm.  The distal system was then checked for the presence of  spinal fluid by simply pumping the shunt and then after several Valsalva  maneuvers his fluid was noted to emanate distally from the catheter.  The  distal catheter was then buried into the peritoneum. A pursestring 3-0  suture in the posterior rectus sheath was tightened snugly with very minimal  resistance around the catheter and then the incisions were closed in layers  with 3-0 Vicryl in an interrupted fashion until the subcuticular layer was  closed with 3-0 Vicryl also.  The other incisions were closed in a similar  fashion.  Surgical staples were placed in the neck and in the scalp.  The  system was then programmed with the available programming system to 130 mm  of water.  The patient tolerated the procedure well and was returned to the  recovery room in stable condition.                                               Stefani Dama, M.D.    Merla Riches  D:  01/04/2002  T:  01/05/2002  Job:  [id number]

## 2010-05-24 NOTE — Procedures (Signed)
[place]. [facility]  Patient:    SHEMAR, PLEMMONS                          MRN: [id number] Proc. Date: 07/14/00 Adm. Date:  [id number] Attending:  Erich Montane                           Procedure Report  CLINICAL INFORMATION:  This patient is a 76 year old gentleman with a history of gait disorder and a prior history of lower back and left leg pain.  He has had enlargement of the ventricular system.  DESCRIPTION OF PROCEDURE:  The patient was prepped and draped, left lateral decubitus position.  Using Betadine and 1% Xylocaine, the L4-5 interspace was entered without difficulty.  Opening pressure was 190 mmH2O, and 33 cc of clear, colorless CSF was obtained and removed for protein, glucose, cell count and differential, IgG, oligoclonal IgG, and to be held.  The patient tolerated the procedure well.  He was examined before and after the procedure and noted to have some improvement in gait. DD:  07/14/00 TD:  07/14/00 Job: [id number]

## 2010-05-24 NOTE — Discharge Summary (Signed)
[place]. [facility]  Patient:    Walter Carey, Walter Carey                          MRN: [id number] Adm. Date:  [id number] Disc. Date: [id number] Attending:  Melvenia Needles Dictator:   Sherrie George, P.A. CC:         Tammy R. Collins [place], M.D., [place], [place]  Dr. Kevan Ny   Discharge Summary  DATE OF BIRTH:  04-26-1934.  ADMISSION DIAGNOSES: 1. Severe left internal carotid artery stenosis, asymptomatic. 2. Peripheral vascular occlusive disease, status post iliac angioplasty    October 2001, P. Gregory Hayes, M.D. 3. Hypothyroidism. 4. Peripheral neuropathy. 5. Hypertension. 6. History of tobacco use ongoing. 7. Gastroesophageal reflux disease.  DISCHARGE DIAGNOSES: 1. Severe left internal carotid artery stenosis, asymptomatic. 2. Peripheral vascular occlusive disease, status post iliac angioplasty    October 2001, P. Gregory Hayes, M.D. 3. Hypothyroidism. 4. Peripheral neuropathy. 5. Hypertension. 6. History of tobacco use ongoing. 7. Gastroesophageal reflux disease.  BRIEF HISTORY:  The patient is a 76 year old white male who presented with peripheral vascular occlusive disease and was found to have a left-sided bruit.  Dopplers were obtained and showed significant left ICA stenosis. It was recommended the patient undergo elective left carotid endarterectomy to prevent stroke.  This was discussed in detail.  Informed operative consent was obtained. The patient was admitted electively for the same now.  ALLERGIES:  VERSED.  MEDICATIONS ON ADMISSION: 1. Zestril 20 mg q.d. 2. Synthroid 0.112 mg q.d. 3. Amitriptyline 50 mg q.h.s. 4. Neurontin 800 mg t.i.d. 5. Calcium supplement 1200 mg q.d.  Further further history and physical, please see the dictated note.  HOSPITAL COURSE:  The patient was taken to the OR for short stay and underwent a left carotid endarterectomy and Dacron patch angioplasty.  He tolerated it well and awoke neurologically intact.   The first postoperative morning he was doing quite well, neurologically intact and wounds looked good.  Attempts to take out his Foley have been unsuccessful.  We were unable to remove the Foley and he was subsequently sent to radiology where they imaged the Foley in the bladder using ultrasound and then the balloon was perforated percutaneously. The patient is now up and walking, anxious to leave.  He has been able to void a small amount, slightly blood tinged, but overall is doing quite well.  He continues to have a broad based gait which was present on his admission.  DISCHARGE ACTIVITY:  Light to moderate.  No lifting over 10 pounds.  No driving, no strenuous activity for two weeks.  DISCHARGE MEDICATIONS:  He will resume all his preadmission medications as before.  He was given Tylox one to two p.o. q.4h. p.r.n.  DISCHARGE DIET:  Low fat, low salt.  WOUND CARE:  He is to clean his wounds his plain soap and water.  He has been instructed to drink lots of fluid today and call if he has any trouble voiding once he gets home.  FOLLOW-UP:  He will return to see Dr. Madilyn Fireman in two weeks on Dalton 13, 2002, at 8:30 a.m.  CONDITION ON DISCHARGE:  Improved. DD:  05/09/00 TD:  05/11/00 Job: [id number]
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
## 2010-05-24 NOTE — Op Note (Signed)
[place]  Patient:    Walter Carey, Walter Carey                          MRN: [id number] Proc. Date: 05/08/00 Adm. Date:  [id number] Disc. Date: [id number] Attending:  Melvenia Needles                           Operative Report  PREOPERATIVE DIAGNOSIS:  Severe left internal carotid artery stenosis.  POSTOPERATIVE DIAGNOSIS:  Severe left internal carotid artery stenosis.  PROCEDURE:  Left carotid endarterectomy and Dacron patch angioplasty.  SURGEON:  Denman George, M.D.  ASSISTANT:  Adair Patter, P.A.  ANESTHESIA:  General endotracheal.  ANESTHESIOLOGIST:  Halford Decamp, M.D.  CLINICAL NOTE:  This is a 76 year old male with a history of a left carotid bruit.  He had previously undergone iliac angioplasty.  Doppler evaluation revealed severe left internal carotid artery stenosis.  The patient was seen and evaluated in the office.  Doppler findings discussed and recommended that he undergo left carotid endarterectomy for __________.  He consented for surgery.  Risks and benefits of the operative procedure were discussed in detail including the potential risks of major complication.  Approximately 1-2% incident of stroke, MI, and death.  DESCRIPTION OF PROCEDURE:  The patient was brought to the operating room in stable condition.  Placed in the supine position.  General endotracheal anesthesia induced.  A Foley catheter and arterial line monitoring carried out.  The left neck prepped and draped in a sterile fashion in the supine position.  A curvilinear skin incision was made along the anterior border of the left sternomastoid muscle.  The subcutaneous tissue and lymphatics divided with electrocautery.  Deep dissection was carried down to the carotid sheath. The common carotid artery mobilized at the omohyoid muscle and encircled with a vessel loop.  The carotid bifurcation exposed.  Internal carotid artery encircled with a vessel loop at the  posterior belly of the digastric muscle. The hyperglossal nerve reflected superiorly and preserved.  The origin of the superior thyroid and external carotids were encircled vessel loops.  The patient was administered 7000 units of heparin intravenously.  Adequate circulation time permitted.  The carotid vessel was controlled with clamps.  A longitudinal arteriotomy made in the distal common carotid artery.  The arteriotomy extended across the carotid bulb and up into the internal carotid artery.  There was a 90% left internal carotid artery stenosis present.  A shunt was inserted.  The plaque was removed with endarterectomy elevated.  The plaque divided proximally and the common carotid artery with Potts scissors.  The external carotid and superior thyroid were endarterectomized using an eversion technique.  The internal carotid plaque feathered out well.  Fragments of plaque were removed with plaque forceps.  The site irrigated with heparin and saline solution.  A preclotted knitted Dacron patch was placed over the endarterectomy site using a running 6-0 Prolene suture.  The shunt was then removed.  All this was well-flushed.  Clamps were removed, directing initial antegrade flow up the external carotid artery.  Following that, the internal carotid opened.  Excellent pulse and Doppler signal over the distal internal carotid artery.  Adequate hemostasis was obtained.  The sponge and instrument counts were correct.  The patient was administered 50 mg of Protamine intravenously.  Adequate circulation time permitted.  The sternomastoid fascia was closed with a running 2-0 Vicryl  suture.  The platysma was closed with a running 3-0 Vicryl suture.  The skin closed with 4-0 Monocryl.  Half inch Steri-Strips were applied.  The patient tolerated the procedure well.  Transferred to the recovery room in stable condition. DD:  05/08/00 TD:  05/11/00 Job: [id number]
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
## 2010-05-24 NOTE — H&P (Signed)
[place]  Patient:    Walter Carey, Walter Carey                            MRN: [id number] Adm. Date:  05/08/00 Attending:  Denman George, M.D. Dictator:   Durenda Age, P.A.-C. CC:         Pearla Dubonnet, M.D.             Tammy R. Collins Scotland, M.D.                         History and Physical  DATE OF BIRTH:  07/29/1934  CHIEF COMPLAINT:  Left carotid artery disease.  HISTORY OF PRESENT ILLNESS:  A 76 year old white male with a history of PVOD presented for a follow-up visit in April 2002 at Dr. Madilyn Fireman office. Because of a bruit heard previously in the left side of the neck, Dopplers were performed.  This showed severe left ICA stenosis with a heterogenous plaque at the bifurcation of the ICA with an end-diastolic velocity of 166 cm/second.  Based on these findings, Dr. Madilyn Fireman recommended to proceed with left CEA in order to reduce the risk of stroke.  The patient complains of right lacunar headaches, dizziness when getting up, "running into things," left arm and left leg tingling on occasion which resolves within 30 minutes, left arm muscle weakness occasionally, slightly slurred speech impairment.  No nausea, vomiting, or vertigo.  No numbness.  No dysphagia or vision changes. No confusion or memory loss.  No syncope or presyncope.  PAST MEDICAL HISTORY:  Coronary artery disease, PVOD--claudication, hypothyroidism, peripheral neuropathy, hypertension, history of tobacco abuse--continues, GERD symptoms, arthritis.  PAST SURGICAL HISTORY:  Status post left iliac angioplasty in October 2001, Dr. Madilyn Fireman.  Status post TURP in 1994.  Status post myelogram in 1993.  Status post lumbar puncture, Dr. Sandria Manly, in 1999.  Status post neck surgery in 1987. Status post appendectomy in 1957.  MEDICATIONS: 1. Zestril 20 mg q.d. 2. Synthroid 0.112 mg p.o. q.d. 3. Amitriptyline 50 mg q.h.s. 4. Neurontin 800 mg q.d. 5. Calcium supplement 1200 mg q.d.  ALLERGIES:   ______ "holding the esophagus."  REVIEW OF SYSTEMS:  See HPI and past medical history for significant positives.  The patient has shortness of breath on exertion, which is chronic secondary to his tobacco use.  No diabetes, kidney disease, or asthma.  FAMILY HISTORY:  Mother died of stroke at 43.  Father died of CHF at 83 with a history of diabetes.  SOCIAL HISTORY:  Married, two children.  He is retired.  He smokes one pack a day of cigarettes for the last 40 years.  He denies any alcohol intake.  PHYSICAL EXAMINATION:  GENERAL:  A well-developed, well-nourished 76 year old white male in no acute distress.  Alert and oriented x 3.  VITAL SIGNS:  Blood pressure 110/70, pulse 80, respirations 18.  HEENT:  Normocephalic, atraumatic.  PERRLA.  EOMI.  Bilateral cataracts.  NECK:  Supple.  No JVD, bilateral bruits, no lymphadenopathy.  CHEST:  Symmetrical on inspiration.  Lungs clear to auscultation with decreased breath sounds at the bases.  CARDIOVASCULAR:  Regular rate and rhythm.  No murmurs, rubs, or gallops.  ABDOMEN:  Soft, nontender.  Bowel sounds x 4.  There is a palpable aorta of 3 cm, pulsatile, nontender, no bruits.  GENITOURINARY/RECTAL:  Deferred.  EXTREMITIES:  No clubbing, cyanosis, or edema.  No ulcerations.  Warm temperature.  Peripheral pulses:  Carotids 2+ bilaterally.  Femoral and popliteal 2+ bilaterally.  Dorsalis pedis and posterior tibialis 1+ bilaterally.  NEUROLOGIC:  Nonfocal.  Gait steady.  DTRs 2+ bilaterally.  Muscle strength 5/5.  ASSESSMENT AND PLAN:  Left ______  for left carotid endarterectomy on [place] 3, 2002, Dr. Madilyn Fireman.  Dr. Madilyn Fireman has seen and evaluated this patient prior to the admission and has explained the risks and benefits involving the procedure and the patient has agreed to continue. DD:  05/08/00 TD:  05/08/00 Job: [id number]

## 2010-07-15 ENCOUNTER — Ambulatory Visit
Admission: RE | Admit: 2010-07-15 | Discharge: 2010-07-15 | Disposition: A | Discharge status: Home / Self Care | Payer: Medicare Other | Source: Ambulatory Visit | Attending: Family Medicine | Admitting: Family Medicine

## 2010-07-15 ENCOUNTER — Other Ambulatory Visit: Payer: Self-pay | Admitting: Family Medicine

## 2010-07-15 DIAGNOSIS — W19XXXA Unspecified fall, initial encounter: Secondary | ICD-10-CM

## 2010-11-11 ENCOUNTER — Other Ambulatory Visit: Payer: Self-pay | Admitting: Neurology

## 2010-11-11 DIAGNOSIS — I10 Essential (primary) hypertension: Secondary | ICD-10-CM

## 2010-11-11 DIAGNOSIS — I6200 Nontraumatic subdural hemorrhage, unspecified: Secondary | ICD-10-CM

## 2010-11-11 DIAGNOSIS — R413 Other amnesia: Secondary | ICD-10-CM

## 2010-11-11 DIAGNOSIS — E785 Hyperlipidemia, unspecified: Secondary | ICD-10-CM

## 2010-11-11 DIAGNOSIS — G910 Communicating hydrocephalus: Secondary | ICD-10-CM

## 2010-11-12 ENCOUNTER — Ambulatory Visit
Admission: RE | Admit: 2010-11-12 | Discharge: 2010-11-12 | Disposition: A | Discharge status: Home / Self Care | Payer: Medicare Other | Source: Ambulatory Visit | Attending: Neurology | Admitting: Neurology

## 2010-11-12 DIAGNOSIS — G910 Communicating hydrocephalus: Secondary | ICD-10-CM

## 2010-11-12 DIAGNOSIS — I6200 Nontraumatic subdural hemorrhage, unspecified: Secondary | ICD-10-CM

## 2010-11-12 DIAGNOSIS — I10 Essential (primary) hypertension: Secondary | ICD-10-CM

## 2010-11-12 DIAGNOSIS — R413 Other amnesia: Secondary | ICD-10-CM

## 2010-11-12 DIAGNOSIS — E785 Hyperlipidemia, unspecified: Secondary | ICD-10-CM

## 2012-05-19 ENCOUNTER — Telehealth: Payer: Self-pay | Admitting: Neurology

## 2012-06-15 NOTE — Telephone Encounter (Signed)
Will give to [facility] to find the right doctor for patient. And patient will be called for apt.

## 2012-07-05 IMAGING — CT CT HEAD W/O CM
2 series · 16 of 30 positions shown, 20 images · non-contrast
Comparison: none

[Series 3: head bone · axial · 0.45mm/px · z∈[+20,+60]mm · 3 of 28 slices shown]
[im 2/28  bone]
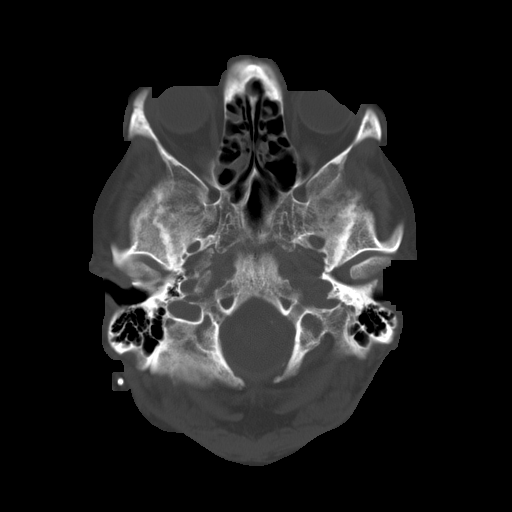
[im 6/28  bone]
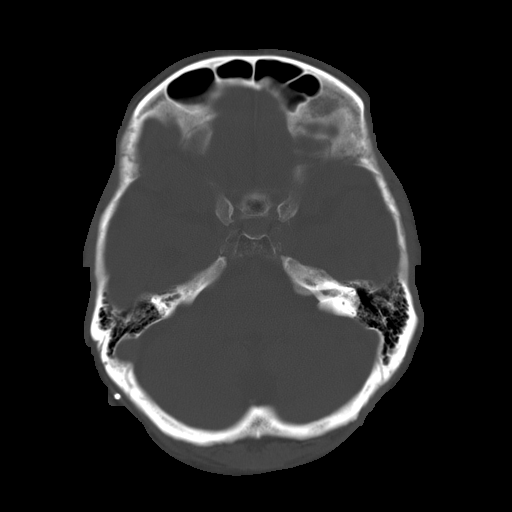
[im 10/28  bone]
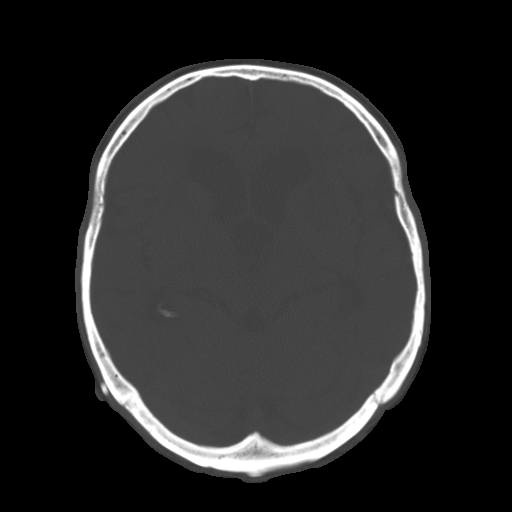

[Series 32: 3d filtered head w/o · axial · non-contrast · 0.45mm/px · z∈[+20,+141]mm · 13 of 28 slices shown, 17 images]
[im 2/28  brain]
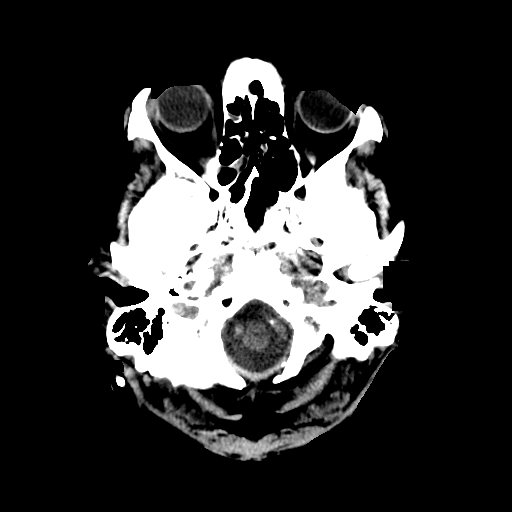
[im 2/28  bone]
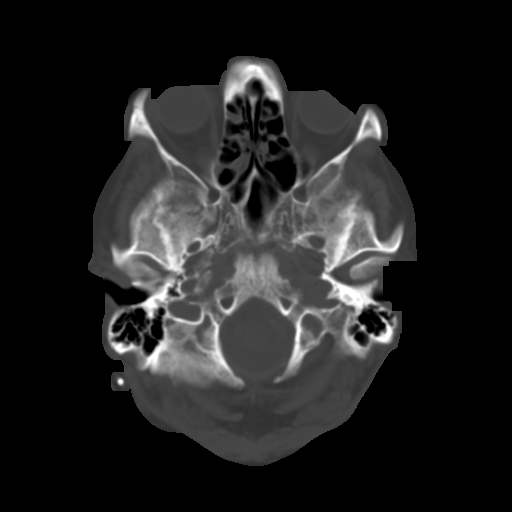
[im 4/28  brain]
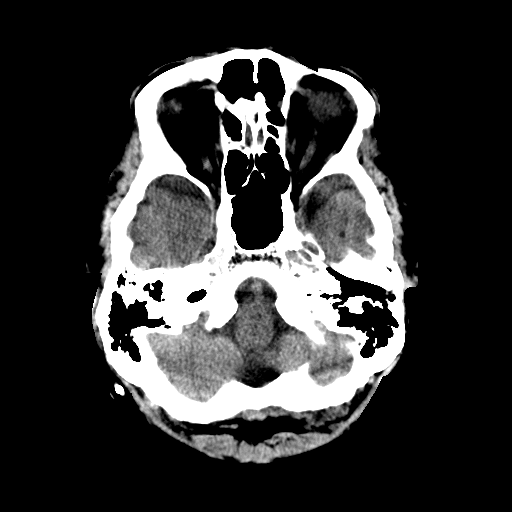
[im 6/28  brain]
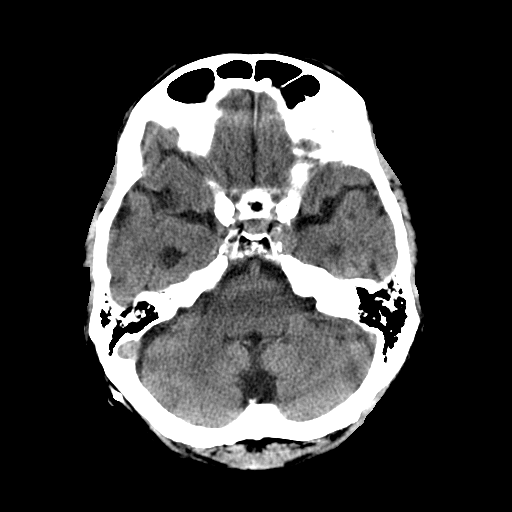
[im 8/28  brain]
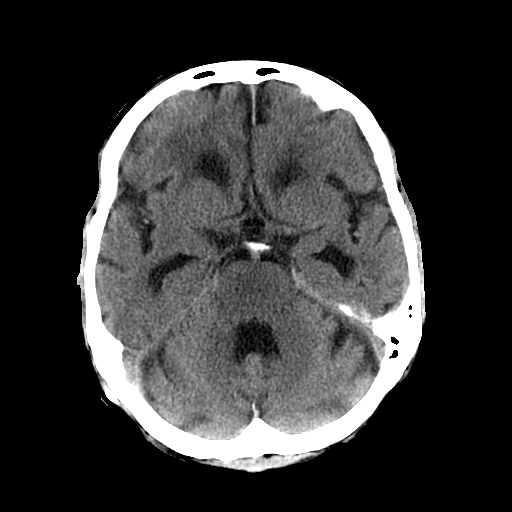
[im 10/28  brain]
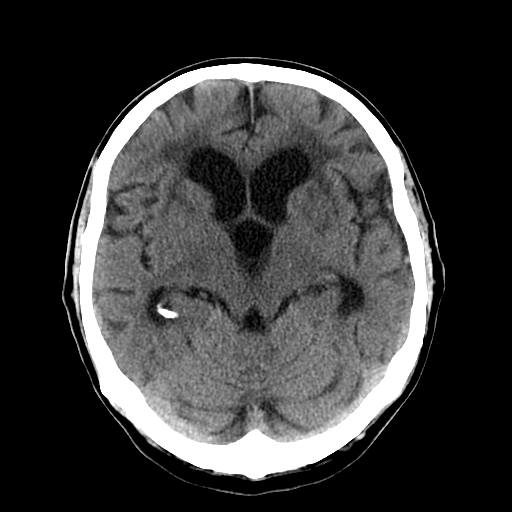
[im 10/28  bone]
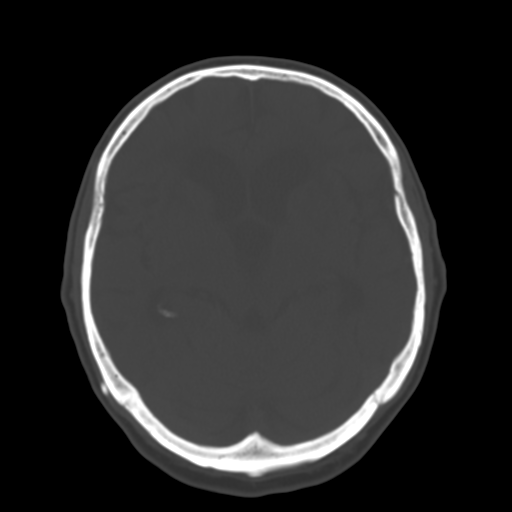
[im 12/28  brain]
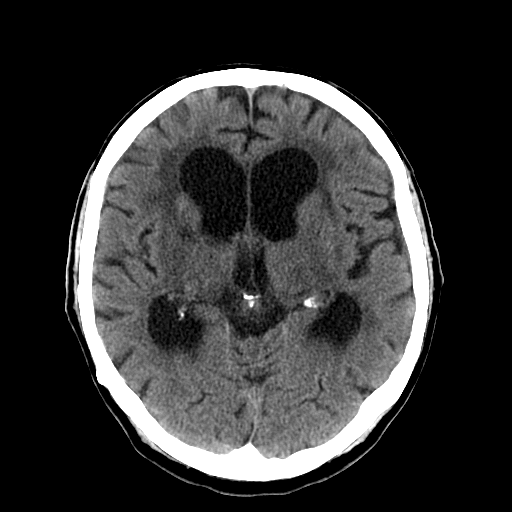
[im 14/28  brain]
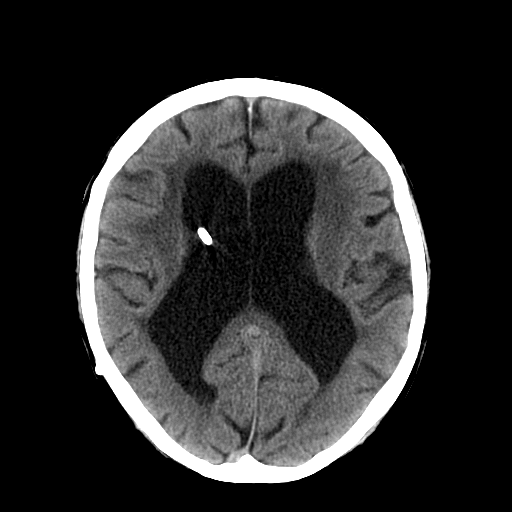
[im 16/28  brain]
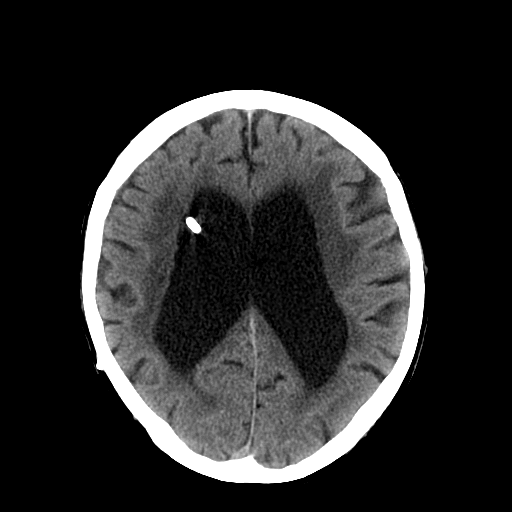
[im 18/28  brain]
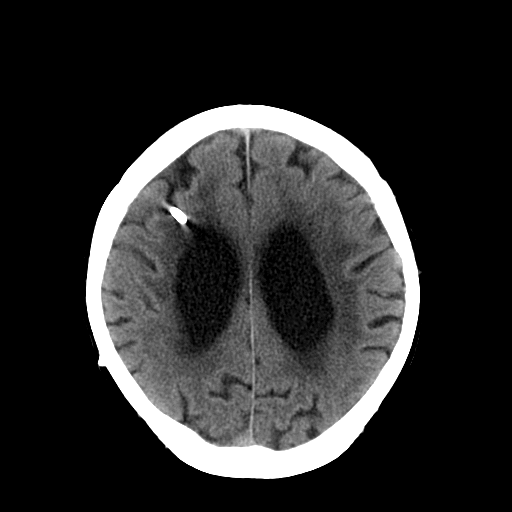
[im 18/28  bone]
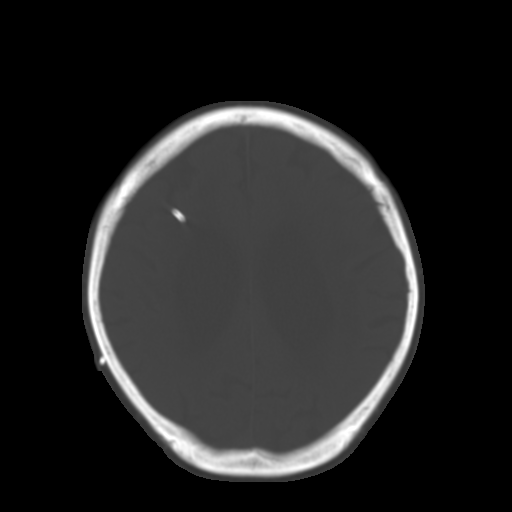
[im 20/28  brain]
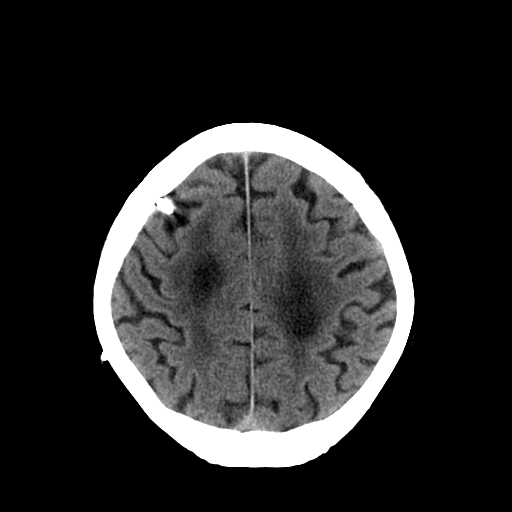
[im 22/28  brain]
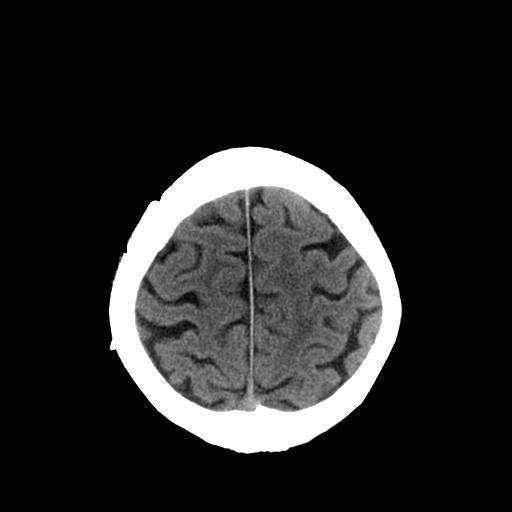
[im 24/28  brain]
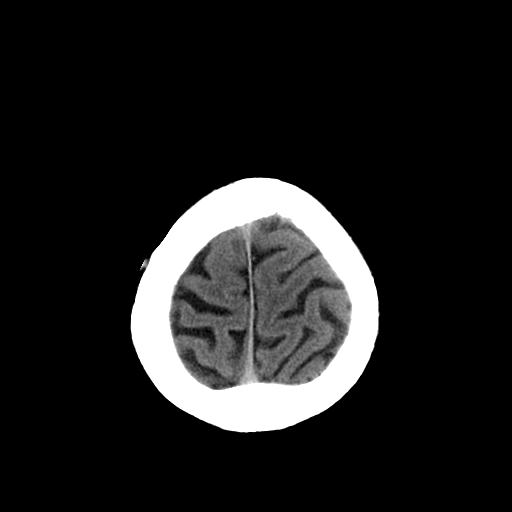
[im 26/28  brain]
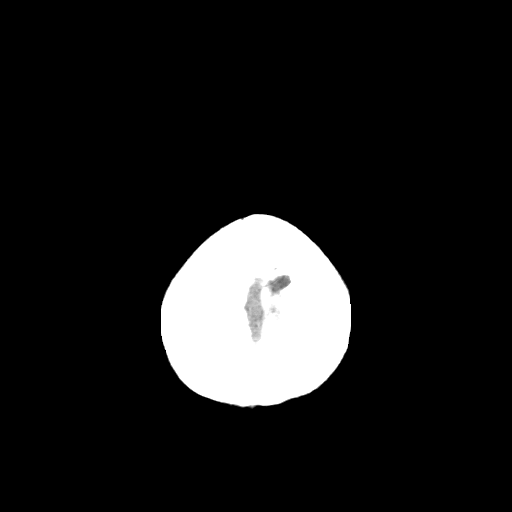
[im 26/28  bone]
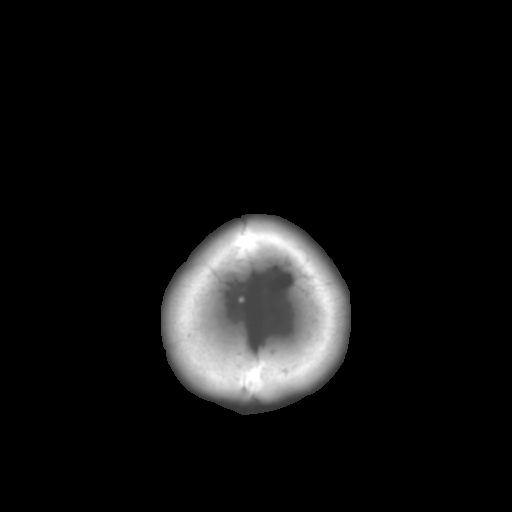

[16 of 30 positions shown; findings below may reference images not displayed]

This examination was performed at [HOSPITAL] at [HOSPITAL]
[HOSPITAL]. The interpretation will be provided by [REDACTED].

## 2012-07-07 ENCOUNTER — Encounter (HOSPITAL_COMMUNITY): Payer: Self-pay | Admitting: Emergency Medicine

## 2012-07-07 ENCOUNTER — Inpatient Hospital Stay (HOSPITAL_COMMUNITY)
Admission: EM | Admit: 2012-07-07 | Discharge: 2012-07-13 | DRG: 309 | Disposition: A | Discharge status: Home Health | Payer: Medicare Other | Attending: Internal Medicine | Admitting: Internal Medicine

## 2012-07-07 ENCOUNTER — Emergency Department (HOSPITAL_COMMUNITY): Payer: Medicare Other

## 2012-07-07 DIAGNOSIS — I10 Essential (primary) hypertension: Secondary | ICD-10-CM

## 2012-07-07 DIAGNOSIS — M8448XA Pathological fracture, other site, initial encounter for fracture: Secondary | ICD-10-CM | POA: Diagnosis present

## 2012-07-07 DIAGNOSIS — I4891 Unspecified atrial fibrillation: Secondary | ICD-10-CM

## 2012-07-07 DIAGNOSIS — Z982 Presence of cerebrospinal fluid drainage device: Secondary | ICD-10-CM

## 2012-07-07 DIAGNOSIS — S22009A Unspecified fracture of unspecified thoracic vertebra, initial encounter for closed fracture: Secondary | ICD-10-CM | POA: Diagnosis present

## 2012-07-07 DIAGNOSIS — W19XXXA Unspecified fall, initial encounter: Secondary | ICD-10-CM

## 2012-07-07 DIAGNOSIS — F0391 Unspecified dementia with behavioral disturbance: Secondary | ICD-10-CM

## 2012-07-07 DIAGNOSIS — I951 Orthostatic hypotension: Secondary | ICD-10-CM | POA: Diagnosis not present

## 2012-07-07 DIAGNOSIS — G589 Mononeuropathy, unspecified: Secondary | ICD-10-CM | POA: Diagnosis present

## 2012-07-07 DIAGNOSIS — I129 Hypertensive chronic kidney disease with stage 1 through stage 4 chronic kidney disease, or unspecified chronic kidney disease: Secondary | ICD-10-CM | POA: Diagnosis present

## 2012-07-07 DIAGNOSIS — I498 Other specified cardiac arrhythmias: Principal | ICD-10-CM | POA: Diagnosis present

## 2012-07-07 DIAGNOSIS — W19XXXD Unspecified fall, subsequent encounter: Secondary | ICD-10-CM

## 2012-07-07 DIAGNOSIS — N183 Chronic kidney disease, stage 3 unspecified: Secondary | ICD-10-CM

## 2012-07-07 DIAGNOSIS — R7989 Other specified abnormal findings of blood chemistry: Secondary | ICD-10-CM

## 2012-07-07 DIAGNOSIS — S22089A Unspecified fracture of T11-T12 vertebra, initial encounter for closed fracture: Secondary | ICD-10-CM

## 2012-07-07 DIAGNOSIS — F03918 Unspecified dementia, unspecified severity, with other behavioral disturbance: Secondary | ICD-10-CM | POA: Diagnosis not present

## 2012-07-07 DIAGNOSIS — S22089S Unspecified fracture of T11-T12 vertebra, sequela: Secondary | ICD-10-CM

## 2012-07-07 DIAGNOSIS — R29890 Loss of height: Secondary | ICD-10-CM | POA: Diagnosis present

## 2012-07-07 DIAGNOSIS — W19XXXS Unspecified fall, sequela: Secondary | ICD-10-CM

## 2012-07-07 DIAGNOSIS — G919 Hydrocephalus, unspecified: Secondary | ICD-10-CM

## 2012-07-07 DIAGNOSIS — Z9181 History of falling: Secondary | ICD-10-CM

## 2012-07-07 DIAGNOSIS — I4719 Other supraventricular tachycardia: Secondary | ICD-10-CM

## 2012-07-07 DIAGNOSIS — I471 Supraventricular tachycardia: Secondary | ICD-10-CM

## 2012-07-07 DIAGNOSIS — F0390 Unspecified dementia without behavioral disturbance: Secondary | ICD-10-CM

## 2012-07-07 DIAGNOSIS — Z79899 Other long term (current) drug therapy: Secondary | ICD-10-CM

## 2012-07-07 HISTORY — DX: Polyneuropathy, unspecified: G62.9

## 2012-07-07 HISTORY — DX: Unspecified fall, initial encounter: W19.XXXA

## 2012-07-07 HISTORY — DX: Repeated falls: R29.6

## 2012-07-07 HISTORY — DX: Chronic kidney disease, stage 3 (moderate): N18.3

## 2012-07-07 HISTORY — DX: Malignant (primary) neoplasm, unspecified: C80.1

## 2012-07-07 HISTORY — DX: Other supraventricular tachycardia: I47.19

## 2012-07-07 HISTORY — DX: Supraventricular tachycardia: I47.1

## 2012-07-07 HISTORY — DX: Hydrocephalus, unspecified: G91.9

## 2012-07-07 HISTORY — DX: Essential (primary) hypertension: I10

## 2012-07-07 HISTORY — DX: Chronic kidney disease, stage 3 unspecified: N18.30

## 2012-07-07 HISTORY — DX: Unspecified dementia, unspecified severity, without behavioral disturbance, psychotic disturbance, mood disturbance, and anxiety: F03.90

## 2012-07-07 LAB — POCT I-STAT, CHEM 8
Calcium, Ion: 1.15 mmol/L (ref 1.13–1.30)
Chloride: 104 mEq/L (ref 96–112)
Glucose, Bld: 91 mg/dL (ref 70–99)
HCT: 38 % — ABNORMAL LOW (ref 39.0–52.0)
Hemoglobin: 12.9 g/dL — ABNORMAL LOW (ref 13.0–17.0)
TCO2: 24 mmol/L (ref 0–100)

## 2012-07-07 LAB — URINALYSIS, ROUTINE W REFLEX MICROSCOPIC
Bilirubin Urine: NEGATIVE
Glucose, UA: NEGATIVE mg/dL
Ketones, ur: NEGATIVE mg/dL
Protein, ur: NEGATIVE mg/dL
pH: 7 (ref 5.0–8.0)

## 2012-07-07 LAB — URINE MICROSCOPIC-ADD ON

## 2012-07-07 LAB — CBC WITH DIFFERENTIAL/PLATELET
Basophils Relative: 0 % (ref 0–1)
Eosinophils Absolute: 0.1 10*3/uL (ref 0.0–0.7)
Eosinophils Relative: 1 % (ref 0–5)
HCT: 35.7 % — ABNORMAL LOW (ref 39.0–52.0)
Hemoglobin: 12.7 g/dL — ABNORMAL LOW (ref 13.0–17.0)
MCH: 31.2 pg (ref 26.0–34.0)
MCHC: 35.6 g/dL (ref 30.0–36.0)
Monocytes Absolute: 1.1 10*3/uL — ABNORMAL HIGH (ref 0.1–1.0)
Monocytes Relative: 13 % — ABNORMAL HIGH (ref 3–12)

## 2012-07-07 LAB — POCT I-STAT TROPONIN I

## 2012-07-07 MED ORDER — LEVOTHYROXINE SODIUM 137 MCG PO TABS
137.0000 ug | ORAL_TABLET | Freq: Every day | ORAL | Status: DC
  Administered 2012-07-08 – 2012-07-13 (×6): 137 ug via ORAL
  Filled 2012-07-07 (×10): qty 1

## 2012-07-07 MED ORDER — ACETAMINOPHEN 650 MG RE SUPP: 650.0000 mg | Freq: Four times a day (QID) | RECTAL | Status: DC | PRN

## 2012-07-07 MED ORDER — DILTIAZEM HCL 100 MG IV SOLR: 5.0000 mg/h | INTRAVENOUS | Status: DC

## 2012-07-07 MED ORDER — HEPARIN SODIUM (PORCINE) 5000 UNIT/ML IJ SOLN: 5000.0000 [IU] | Freq: Three times a day (TID) | INTRAMUSCULAR | Status: DC

## 2012-07-07 MED ORDER — TRAMADOL HCL 50 MG PO TABS
50.0000 mg | ORAL_TABLET | Freq: Three times a day (TID) | ORAL | Status: DC | PRN
  Administered 2012-07-07 – 2012-07-09 (×4): 50 mg via ORAL
  Filled 2012-07-07 (×6): qty 1

## 2012-07-07 MED ORDER — HEPARIN SODIUM (PORCINE) 5000 UNIT/ML IJ SOLN
5000.0000 [IU] | Freq: Three times a day (TID) | INTRAMUSCULAR | Status: DC
  Administered 2012-07-07 – 2012-07-12 (×12): 5000 [IU] via SUBCUTANEOUS
  Filled 2012-07-07 (×20): qty 1

## 2012-07-07 MED ORDER — SERTRALINE HCL 25 MG PO TABS
25.0000 mg | ORAL_TABLET | Freq: Every day | ORAL | Status: DC
  Administered 2012-07-08 – 2012-07-13 (×5): 25 mg via ORAL
  Filled 2012-07-07 (×6): qty 1

## 2012-07-07 MED ORDER — SODIUM CHLORIDE 0.9 % IV SOLN
INTRAVENOUS | Status: DC
  Administered 2012-07-09: 20:00:00 via INTRAVENOUS

## 2012-07-07 MED ORDER — ACETAMINOPHEN 325 MG PO TABS
650.0000 mg | ORAL_TABLET | Freq: Four times a day (QID) | ORAL | Status: DC | PRN
  Administered 2012-07-08 – 2012-07-13 (×4): 650 mg via ORAL
  Filled 2012-07-07 (×4): qty 2

## 2012-07-07 MED ORDER — SODIUM CHLORIDE 0.9 % IV SOLN
INTRAVENOUS | Status: DC
  Administered 2012-07-07: via INTRAVENOUS

## 2012-07-07 MED ORDER — ADENOSINE 6 MG/2ML IV SOLN: 6.0000 mg | Freq: Once | INTRAVENOUS | Status: DC

## 2012-07-07 MED ORDER — SODIUM CHLORIDE 0.9 % IJ SOLN
3.0000 mL | Freq: Two times a day (BID) | INTRAMUSCULAR | Status: DC
  Administered 2012-07-08 – 2012-07-10 (×3): 3 mL via INTRAVENOUS

## 2012-07-07 MED ORDER — SODIUM CHLORIDE 0.9 % IJ SOLN
3.0000 mL | Freq: Two times a day (BID) | INTRAMUSCULAR | Status: DC
  Administered 2012-07-08 – 2012-07-13 (×6): 3 mL via INTRAVENOUS

## 2012-07-07 MED ORDER — ONDANSETRON HCL 4 MG PO TABS: 4.0000 mg | ORAL_TABLET | Freq: Four times a day (QID) | ORAL | Status: DC | PRN

## 2012-07-07 MED ORDER — DILTIAZEM HCL 100 MG IV SOLR
5.0000 mg/h | INTRAVENOUS | Status: DC
  Administered 2012-07-07: 5 mg/h via INTRAVENOUS

## 2012-07-07 MED ORDER — AMITRIPTYLINE HCL 25 MG PO TABS
25.0000 mg | ORAL_TABLET | Freq: Every day | ORAL | Status: DC
  Administered 2012-07-07 – 2012-07-11 (×5): 25 mg via ORAL
  Filled 2012-07-07 (×6): qty 1

## 2012-07-07 MED ORDER — METOPROLOL TARTRATE 25 MG PO TABS
25.0000 mg | ORAL_TABLET | Freq: Once | ORAL | Status: AC
  Administered 2012-07-07: 25 mg via ORAL
  Filled 2012-07-07 (×2): qty 1

## 2012-07-07 MED ORDER — DOCUSATE SODIUM 100 MG PO CAPS
100.0000 mg | ORAL_CAPSULE | Freq: Two times a day (BID) | ORAL | Status: DC
  Administered 2012-07-07 – 2012-07-13 (×11): 100 mg via ORAL
  Filled 2012-07-07 (×13): qty 1

## 2012-07-07 MED ORDER — ADENOSINE 6 MG/2ML IV SOLN
6.0000 mg | Freq: Once | INTRAVENOUS | Status: AC
  Administered 2012-07-07: 6 mg via INTRAVENOUS
  Filled 2012-07-07: qty 4
  Filled 2012-07-07: qty 2

## 2012-07-07 MED ORDER — ONDANSETRON HCL 4 MG/2ML IJ SOLN: 4.0000 mg | Freq: Four times a day (QID) | INTRAMUSCULAR | Status: DC | PRN

## 2012-07-07 MED ORDER — PANTOPRAZOLE SODIUM 40 MG PO TBEC
40.0000 mg | DELAYED_RELEASE_TABLET | Freq: Every day | ORAL | Status: DC
  Administered 2012-07-08 – 2012-07-13 (×5): 40 mg via ORAL
  Filled 2012-07-07 (×5): qty 1

## 2012-07-07 MED ORDER — ONDANSETRON HCL 4 MG/2ML IJ SOLN
4.0000 mg | Freq: Once | INTRAMUSCULAR | Status: AC
  Administered 2012-07-07: 4 mg via INTRAVENOUS
  Filled 2012-07-07: qty 2

## 2012-07-07 NOTE — ED Provider Notes (Addendum)
Care assumed at sign out. Patient fell yesterday. Labs and CT and UA ordered and sign out to f/u results. Labs showed slightly worsening renal failure. UA nl. CT showed stable hydrocephalus with shunt operational. Stable for d/c. Will have him recheck BMP in a week.   8 PM Patient developed SVT with HR 160s.    Date: 07/07/2012  Rate: 163  Rhythm: supraventricular tachycardia (SVT)  QRS Axis: normal  Intervals: normal  ST/T Wave abnormalities: nonspecific ST changes  Conduction Disutrbances:none  Narrative Interpretation:   Old EKG Reviewed: changes noted    8:20 PM Given adenosine 6 mg and now back in sinus rhythm.    Date: 07/07/2012  Rate: *107  Rhythm: sinus tachycardia  QRS Axis: normal  Intervals: normal  ST/T Wave abnormalities: nonspecific ST changes  Conduction Disutrbances:none  Narrative Interpretation:   Old EKG Reviewed: changes noted  8: 45 PM Patient tachycardic again. Suspect new onset afib. I started cardizem drip. Will admit to hospitalist under Dr. Sunnie Nielsen. She request that I call cardiology. I talked to [facility] cardiologist, who discussed with Dr. Sunnie Nielsen.  CRITICAL CARE Performed by: Silverio Lay, Kage Willmann   Total critical care time: 45 min   Critical care time was exclusive of separately billable procedures and treating other patients.  Critical care was necessary to treat or prevent imminent or life-threatening deterioration.  Critical care was time spent personally by me on the following activities: development of treatment plan with patient and/or surrogate as well as nursing, discussions with consultants, evaluation of patient's response to treatment, examination of patient, obtaining history from patient or surrogate, ordering and performing treatments and interventions, ordering and review of laboratory studies, ordering and review of radiographic studies, pulse oximetry and re-evaluation of patient's condition.     Results for orders placed during the  hospital encounter of 07/07/12  URINALYSIS, ROUTINE W REFLEX MICROSCOPIC      Result Value Range   Color, Urine YELLOW  YELLOW   APPearance CLEAR  CLEAR   Specific Gravity, Urine 1.013  1.005 - 1.030   pH 7.0  5.0 - 8.0   Glucose, UA NEGATIVE  NEGATIVE mg/dL   Hgb urine dipstick NEGATIVE  NEGATIVE   Bilirubin Urine NEGATIVE  NEGATIVE   Ketones, ur NEGATIVE  NEGATIVE mg/dL   Protein, ur NEGATIVE  NEGATIVE mg/dL   Urobilinogen, UA 0.2  0.0 - 1.0 mg/dL   Nitrite NEGATIVE  NEGATIVE   Leukocytes, UA TRACE (*) NEGATIVE  CBC WITH DIFFERENTIAL      Result Value Range   WBC 8.6  4.0 - 10.5 K/uL   RBC 4.07 (*) 4.22 - 5.81 MIL/uL   Hemoglobin 12.7 (*) 13.0 - 17.0 g/dL   HCT 10.9 (*) 60.4 - 54.0 %   MCV 87.7  78.0 - 100.0 fL   MCH 31.2  26.0 - 34.0 pg   MCHC 35.6  30.0 - 36.0 g/dL   RDW 98.1  19.1 - 47.8 %   Platelets 245  150 - 400 K/uL   Neutrophils Relative % 69  43 - 77 %   Neutro Abs 6.0  1.7 - 7.7 K/uL   Lymphocytes Relative 17  12 - 46 %   Lymphs Abs 1.4  0.7 - 4.0 K/uL   Monocytes Relative 13 (*) 3 - 12 %   Monocytes Absolute 1.1 (*) 0.1 - 1.0 K/uL   Eosinophils Relative 1  0 - 5 %   Eosinophils Absolute 0.1  0.0 - 0.7 K/uL   Basophils Relative 0  0 - 1 %   Basophils Absolute 0.0  0.0 - 0.1 K/uL  URINE MICROSCOPIC-ADD ON      Result Value Range   Squamous Epithelial / LPF RARE  RARE   WBC, UA 0-2  <3 WBC/hpf   RBC / HPF 0-2  <3 RBC/hpf   Bacteria, UA FEW (*) RARE   Casts HYALINE CASTS (*) NEGATIVE   Urine-Other MUCOUS PRESENT    POCT I-STAT, CHEM 8      Result Value Range   Sodium 138  135 - 145 mEq/L   Potassium 4.3  3.5 - 5.1 mEq/L   Chloride 104  96 - 112 mEq/L   BUN 31 (*) 6 - 23 mg/dL   Creatinine, Ser 1.61 (*) 0.50 - 1.35 mg/dL   Glucose, Bld 91  70 - 99 mg/dL   Calcium, Ion 0.96  0.45 - 1.30 mmol/L   TCO2 24  0 - 100 mmol/L   Hemoglobin 12.9 (*) 13.0 - 17.0 g/dL   HCT 40.9 (*) 81.1 - 91.4 %   Dg Chest 2 View  07/07/2012   *RADIOLOGY REPORT*  Clinical Data:  Fall.  Hypoxia.  CHEST - 2 VIEW  Comparison: Two-view chest 04/03/2008.  Findings: Low lung volumes exaggerate the heart size.  Mild pulmonary vascular congestion is present.  No focal airspace consolidation is evident.  No significant effusions are present. Mild bibasilar atelectasis is noted.  A VP shunt catheter is in place.  IMPRESSION:  1.  Low lung volumes and mild pulmonary vascular congestion. 2.  No significant airspace consolidation.   Original Report Authenticated By: Marin Roberts, M.D.   Dg Lumbar Spine Complete  07/07/2012   *RADIOLOGY REPORT*  Clinical Data: Fall.  Low back pain.  LUMBAR SPINE - COMPLETE 4+ VIEW  Comparison: Lumbar spine radiographs 01/10/2008.  Findings: Transitional anatomy is again noted at L5/S1.  Vertebral body heights and alignment are maintained.  There is progressive loss of disc height at L3-4 with endplate changes.  Chronic loss of disc height at L4-5 is stable.  Mild facet degenerative changes are noted bilaterally.  A proximal left iliac artery stent is stable in position.  IMPRESSION:  1.  Transitional anatomy is again noted. 2.  Progressive vertebral body loss and endplate changes at L3-4. 3.  Stable chronic loss of disc height at L4-5. 4.  A proximal left iliac artery stent is in place.   Original Report Authenticated By: Marin Roberts, M.D.   Ct Head Wo Contrast  07/07/2012   *RADIOLOGY REPORT*  Clinical Data: Fall  CT HEAD WITHOUT CONTRAST  Technique:  Contiguous axial images were obtained from the base of the skull through the vertex without contrast.  Comparison: Prior CT from 11/12/2010  Findings: Right frontal approach ventricular catheter terminates in the right lateral ventricle.  Overall ventricular size is not significantly changed as compared to the prior study. Scattered and confluent hypodensity within the periventricular white matter is consistent with chronic small vessel ischemic changes. Calcification within the cerebellar vermis is  again noted, unchanged.  No acute intracranial hemorrhage or infarct.  No mass lesion or midline shift.  No extra-axial fluid collection.  Calvarium is intact.  Scattered mucosal thickening is present within the ethmoidal air cells.  Mastoid air cells are clear.  IMPRESSION:  1.   No significant interval change in hydrocephalus with cerebral atrophy and chronic small vessel ischemic changes.  Right ventricular catheter is stable in position with tip in the right lateral ventricle.  2.  No acute intracranial process.   Original Report Authenticated By: Rise Mu, M.D.      Richardean Canal, MD 07/07/12 2003  Richardean Canal, MD 07/07/12 2122  Richardean Canal, MD 07/07/12 2123

## 2012-07-07 NOTE — ED Notes (Signed)
Patient reminded that we need a urine sample. Provided patient with urinal.

## 2012-07-07 NOTE — ED Notes (Signed)
Pt brought to ED by EMS with complaint of fall. As per pt and his wife he fell and hit his head and complains of lower back pain but no LOC.No bruises seen any where.

## 2012-07-07 NOTE — ED Provider Notes (Signed)
History    CSN: [id number] Arrival date & time 07/07/12  1348  First MD Initiated Contact with Patient 07/07/12 1352     Chief Complaint  Patient presents with  . Fall   (Consider location/radiation/quality/duration/timing/severity/associated sxs/prior Treatment) Patient is a 78 y.o. male presenting with fall. History provided by: daughter, spouse, and nurse aide.  Fall The current episode started yesterday. Associated symptoms include headaches. Pertinent negatives include no abdominal pain, change in bowel habit, chest pain, fever or vomiting. The symptoms are aggravated by walking.   History reviewed. No pertinent past medical history. No past surgical history on file. No family history on file. History  Substance Use Topics  . Smoking status: Not on file  . Smokeless tobacco: Not on file  . Alcohol Use: Not on file    Review of Systems  Constitutional: Negative for fever.  Cardiovascular: Negative for chest pain.  Gastrointestinal: Negative for vomiting, abdominal pain and change in bowel habit.  Genitourinary:       Daughter reports pt has foul urine smell.   Neurological: Positive for headaches. Negative for dizziness, syncope and light-headedness.  All other systems reviewed and are negative.    Allergies  Asa; Erythromycin; Morphine and related; Oxycodone; Stadol; Tylox; and Versed  Home Medications   Current Outpatient Rx  Name  Route  Sig  Dispense  Refill  . amitriptyline (ELAVIL) 25 MG tablet   Oral   Take 25 mg by mouth at bedtime.         Marland Kitchen amLODipine (NORVASC) 5 MG tablet   Oral   Take 5 mg by mouth daily.         . B Complex-C (B-COMPLEX WITH VITAMIN C) tablet   Oral   Take 1 tablet by mouth daily.         Marland Kitchen CALCIUM-VITAMIN D PO   Oral   Take 1 tablet by mouth 2 (two) times daily.         . Cholecalciferol (VITAMIN D-3) 1000 UNITS CAPS   Oral   Take 1,000 Units by mouth 3 (three) times daily.         Marland Kitchen donepezil (ARICEPT) 10  MG tablet   Oral   Take 10 mg by mouth at bedtime.         . fish oil-omega-3 fatty acids 1000 MG capsule   Oral   Take 1 g by mouth 2 (two) times daily.         Marland Kitchen levothyroxine (SYNTHROID, LEVOTHROID) 137 MCG tablet   Oral   Take 137 mcg by mouth daily before breakfast.         . Multiple Vitamin (MULTIVITAMIN WITH MINERALS) TABS   Oral   Take 1 tablet by mouth daily.         Marland Kitchen omeprazole (PRILOSEC) 20 MG capsule   Oral   Take 20 mg by mouth 2 (two) times daily.         . sertraline (ZOLOFT) 50 MG tablet   Oral   Take 25 mg by mouth daily.         Marland Kitchen VITAMIN E PO   Oral   Take 1 capsule by mouth daily.             BP 144/87  Pulse 100  Temp(Src) 97.8 F (36.6 C) (Oral)  Resp 22  SpO2 93% Physical Exam  Constitutional: He appears well-developed and well-nourished. No distress.  HENT:  Head: Normocephalic and atraumatic.  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  Eyes: Conjunctivae and EOM are normal. Pupils are equal, round, and reactive to light.  Neck: Neck supple.  Cardiovascular: Normal rate, regular rhythm and intact distal pulses.  Exam reveals no gallop and no friction rub.   No murmur heard. Pulmonary/Chest: Effort normal and breath sounds normal.  Abdominal: Soft. Bowel sounds are normal.  Neurological: He is alert.  Pt has dementia.   Skin: Skin is warm and dry.  Psychiatric: Cognition and memory are impaired.    ED Course  Procedures (including critical care time) Labs Reviewed  URINALYSIS, ROUTINE W REFLEX MICROSCOPIC   No results found.  Date: 07/07/2012  Rate: 100  Rhythm: sinus tachycardia  QRS Axis: normal  Intervals: normal  ST/T Wave abnormalities: normal  Conduction Disutrbances:none  Narrative Interpretation:   Old EKG Reviewed: none available   No diagnosis found.  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  Pt checked out to Dr. Silverio Lay at [phone].  Boykin Peek, MD 07/07/12 [phone]

## 2012-07-07 NOTE — ED Notes (Signed)
Family at bedside. 

## 2012-07-07 NOTE — Progress Notes (Signed)
Asked to review patient's EKGs during episodes of SVT in the ED.  Briefly, this is a 78 yo M with a history of hydrocephalus, presenting with fall, who is being admitted to the hospitalist service.  Last EF on 2005 stress test was normal.  In the ED he had several episodes of a regular, narrow complex tachycardia at 157 bpm.  There are retrograde p waves seen during the tachycardia just after the QRS complex, most notably in lead III and V3/V4.   Recommendations: - This is AVNRT, and is responsive to (and often stopped by) vagal maneuvers and adenosine. - Would start the patient on AV nodal blockers (Beta Blockers vs. Dilatazem or Verapamil) as tolerated.  Yaakov Guthrie, MD

## 2012-07-07 NOTE — ED Notes (Addendum)
Patient currently off the floor for x-ray.

## 2012-07-07 NOTE — ED Notes (Signed)
Pt continues to deny any chest pain.  St's he feels "a little" nauseated.  Family remain at bedside.  Dr. Silverio Lay made aware.

## 2012-07-07 NOTE — H&P (Signed)
[facility] History and Physical  Walter Carey ZOX:[id number] DOB: 1934/07/13 DOA: 07/07/2012  Referring physician: Dr Silverio Lay.  PCP: Hollice Espy, MD  Specialists: [facility]  Chief Complaint: Back pain  HPI: Walter Carey is a 78 y.o. male with PMH significant for Hydrocephalus, HTN, who presents to ED complaining of back pain after a fall. Patient has fall more than 20 times per family. Patient get dizzy, and fall. Last fall was day prior to admission, he hit his head and back. He is complaining of back pain, worse left side thoracic area. He was notice to have HR 150, SVT. He denies chest pain or dyspnea. He is asymptomatic from tachycardia. He received 6 mg of adenosine without significant respond. He was started on IV Cardizem. His Hr decrease to 80 after he was started on Cardizem.   Review of Systems: Negative except as per HPI.   Past Medical History  Diagnosis Date  . Hypertension   . Dementia   . Hydrocephalus with operating shunt   . Neuropathy   . Cancer    Past Surgical History  Procedure Laterality Date  . Carotid artery angioplasty     Social History:  reports that he has never smoked. He does not have any smokeless tobacco history on file. He reports that he does not drink alcohol or use illicit drugs. Family history: non contributory.   Allergies  Allergen Reactions  . Asa (Aspirin)   . Erythromycin   . Morphine And Related   . Oxycodone   . Stadol (Butorphanol)   . Tylox (Oxycodone-Acetaminophen)   . Versed (Midazolam)       Prior to Admission medications   Medication Sig Start Date End Date Taking? Authorizing Provider  amitriptyline (ELAVIL) 25 MG tablet Take 25 mg by mouth at bedtime.   Yes Historical Provider, MD  amLODipine (NORVASC) 5 MG tablet Take 5 mg by mouth daily.   Yes Historical Provider, MD  B Complex-C (B-COMPLEX WITH VITAMIN C) tablet Take 1 tablet by mouth daily.   Yes Historical Provider, MD  CALCIUM-VITAMIN D PO Take 1  tablet by mouth 2 (two) times daily.   Yes Historical Provider, MD  Cholecalciferol (VITAMIN D-3) 1000 UNITS CAPS Take 1,000 Units by mouth 3 (three) times daily.   Yes Historical Provider, MD  donepezil (ARICEPT) 10 MG tablet Take 10 mg by mouth at bedtime.   Yes Historical Provider, MD  fish oil-omega-3 fatty acids 1000 MG capsule Take 1 g by mouth 2 (two) times daily.   Yes Historical Provider, MD  levothyroxine (SYNTHROID, LEVOTHROID) 137 MCG tablet Take 137 mcg by mouth daily before breakfast.   Yes Historical Provider, MD  Multiple Vitamin (MULTIVITAMIN WITH MINERALS) TABS Take 1 tablet by mouth daily.   Yes Historical Provider, MD  omeprazole (PRILOSEC) 20 MG capsule Take 20 mg by mouth 2 (two) times daily.   Yes Historical Provider, MD  sertraline (ZOLOFT) 50 MG tablet Take 25 mg by mouth daily.   Yes Historical Provider, MD  VITAMIN E PO Take 1 capsule by mouth daily.   Yes Historical Provider, MD   Physical Exam: Filed Vitals:   07/07/12 2115 07/07/12 2130 07/07/12 2145 07/07/12 2200  BP: 111/88 102/83 99/80 93/74   Pulse: 159 159 155 156  Temp:      TempSrc:      Resp: 23 26 24 20   SpO2: 96% 95% 97% 92%   General Appearance:    Alert, cooperative, no distress, appears stated age  Head:    Normocephalic, without obvious abnormality, atraumatic  Eyes:    PERRL, conjunctiva/corneas clear, EOM's intact,         Ears:    Normal TM's and external ear canals, both ears  Nose:   Nares normal, septum midline, mucosa normal, no drainage    or sinus tenderness  Throat:   Lips, mucosa, and tongue normal; teeth and gums normal  Neck:   Supple, symmetrical, trachea midline, no adenopathy;       thyroid:  No enlargement/tenderness/nodules; no carotid   bruit or JVD  Back:     Symmetric, no curvature, ROM normal, no CVA tenderness  Lungs:     Clear to auscultation bilaterally, respirations unlabored  Chest wall:    No tenderness or deformity  Heart:    Regular rate and rhythm, S1 and S2  normal, no murmur, rub   or gallop  Abdomen:     Soft, non-tender, bowel sounds active all four quadrants,    no masses, no organomegaly,         Extremities:   Extremities normal, atraumatic, no cyanosis or edema  Pulses:   2+ and symmetric all extremities  Skin:   Skin color, texture, turgor normal, no rashes or lesions  Lymph nodes:   Cervical, supraclavicular, and axillary nodes normal  Neurologic:   CNII-XII intact. Normal strength, sensation and reflexes      throughout       Labs on Admission:  Basic Metabolic Panel:  Recent Labs Lab 07/07/12 1627  NA 138  K 4.3  CL 104  GLUCOSE 91  BUN 31*  CREATININE 2.30*   Liver Function Tests: No results found for this basename: AST, ALT, ALKPHOS, BILITOT, PROT, ALBUMIN,  in the last 168 hours No results found for this basename: LIPASE, AMYLASE,  in the last 168 hours No results found for this basename: AMMONIA,  in the last 168 hours CBC:  Recent Labs Lab 07/07/12 1610 07/07/12 1627  WBC 8.6  --   NEUTROABS 6.0  --   HGB 12.7* 12.9*  HCT 35.7* 38.0*  MCV 87.7  --   PLT 245  --    Cardiac Enzymes: No results found for this basename: CKTOTAL, CKMB, CKMBINDEX, TROPONINI,  in the last 168 hours  BNP (last 3 results) No results found for this basename: PROBNP,  in the last 8760 hours CBG: No results found for this basename: GLUCAP,  in the last 168 hours  Radiological Exams on Admission: Dg Chest 2 View  07/07/2012   *RADIOLOGY REPORT*  Clinical Data: Fall.  Hypoxia.  CHEST - 2 VIEW  Comparison: Two-view chest 04/03/2008.  Findings: Low lung volumes exaggerate the heart size.  Mild pulmonary vascular congestion is present.  No focal airspace consolidation is evident.  No significant effusions are present. Mild bibasilar atelectasis is noted.  A VP shunt catheter is in place.  IMPRESSION:  1.  Low lung volumes and mild pulmonary vascular congestion. 2.  No significant airspace consolidation.   Original Report  Authenticated By: Marin Roberts, M.D.   Dg Lumbar Spine Complete  07/07/2012   *RADIOLOGY REPORT*  Clinical Data: Fall.  Low back pain.  LUMBAR SPINE - COMPLETE 4+ VIEW  Comparison: Lumbar spine radiographs 01/10/2008.  Findings: Transitional anatomy is again noted at L5/S1.  Vertebral body heights and alignment are maintained.  There is progressive loss of disc height at L3-4 with endplate changes.  Chronic loss of disc height at L4-5 is stable.  Mild  facet degenerative changes are noted bilaterally.  A proximal left iliac artery stent is stable in position.  IMPRESSION:  1.  Transitional anatomy is again noted. 2.  Progressive vertebral body loss and endplate changes at L3-4. 3.  Stable chronic loss of disc height at L4-5. 4.  A proximal left iliac artery stent is in place.   Original Report Authenticated By: Marin Roberts, M.D.   Ct Head Wo Contrast  07/07/2012   *RADIOLOGY REPORT*  Clinical Data: Fall  CT HEAD WITHOUT CONTRAST  Technique:  Contiguous axial images were obtained from the base of the skull through the vertex without contrast.  Comparison: Prior CT from 11/12/2010  Findings: Right frontal approach ventricular catheter terminates in the right lateral ventricle.  Overall ventricular size is not significantly changed as compared to the prior study. Scattered and confluent hypodensity within the periventricular white matter is consistent with chronic small vessel ischemic changes. Calcification within the cerebellar vermis is again noted, unchanged.  No acute intracranial hemorrhage or infarct.  No mass lesion or midline shift.  No extra-axial fluid collection.  Calvarium is intact.  Scattered mucosal thickening is present within the ethmoidal air cells.  Mastoid air cells are clear.  IMPRESSION:  1.   No significant interval change in hydrocephalus with cerebral atrophy and chronic small vessel ischemic changes.  Right ventricular catheter is stable in position with tip in the right  lateral ventricle.  2.  No acute intracranial process.   Original Report Authenticated By: Rise Mu, M.D.    EKG: SVT  Assessment/Plan Active Problems:   * No active hospital problems. *  1-SVT: Continue with Cardizem Gtt. Received 6 mg adenosine. Cycle cardiac enzymes. Cardiology consulted.  2-Acute renal on Chronic renal failure. Last Cr per records at 1.8. Cr today at 2. Will start IV fluids. Repeat renal function in am.  3-Frequent fall: CT head no acute abnormalities. Need PT consult.  4-Back Pain: Lumbar x ray no fracture. Check thoracic spine x ray. Will try tramadol for pain.     Code Status: Full Code.  Family Communication: Care discussed with wife and daughter who were at bedside.  Disposition Plan: Expect 3 to 4 days inpatient.  Time spent: 75 minutes.   Charlene Detter [facility] Pager [phone]  If 7PM-7AM, please contact night-coverage [email or web address] Password [facility] 07/07/2012, 10:25 PM

## 2012-07-07 NOTE — ED Notes (Signed)
Pt's heart rate increased to 160 (SVT).  Pt denies any pain or other symptoms.  Dr. Silverio Lay in room to give Adenosine 6mg .  Pt tolerated well.  Family remained at bedside.

## 2012-07-08 ENCOUNTER — Inpatient Hospital Stay (HOSPITAL_COMMUNITY): Payer: Medicare Other

## 2012-07-08 ENCOUNTER — Encounter (HOSPITAL_COMMUNITY): Payer: Self-pay | Admitting: Nurse Practitioner

## 2012-07-08 DIAGNOSIS — S22089A Unspecified fracture of T11-T12 vertebra, initial encounter for closed fracture: Secondary | ICD-10-CM

## 2012-07-08 DIAGNOSIS — G919 Hydrocephalus, unspecified: Secondary | ICD-10-CM | POA: Diagnosis present

## 2012-07-08 DIAGNOSIS — S22009A Unspecified fracture of unspecified thoracic vertebra, initial encounter for closed fracture: Secondary | ICD-10-CM

## 2012-07-08 DIAGNOSIS — F0390 Unspecified dementia without behavioral disturbance: Secondary | ICD-10-CM | POA: Diagnosis present

## 2012-07-08 DIAGNOSIS — I059 Rheumatic mitral valve disease, unspecified: Secondary | ICD-10-CM

## 2012-07-08 DIAGNOSIS — I10 Essential (primary) hypertension: Secondary | ICD-10-CM | POA: Diagnosis present

## 2012-07-08 DIAGNOSIS — I471 Supraventricular tachycardia: Secondary | ICD-10-CM | POA: Diagnosis present

## 2012-07-08 DIAGNOSIS — W19XXXA Unspecified fall, initial encounter: Secondary | ICD-10-CM

## 2012-07-08 DIAGNOSIS — I498 Other specified cardiac arrhythmias: Secondary | ICD-10-CM

## 2012-07-08 LAB — BASIC METABOLIC PANEL
CO2: 26 mEq/L (ref 19–32)
Calcium: 9 mg/dL (ref 8.4–10.5)
Chloride: 100 mEq/L (ref 96–112)
Potassium: 4.1 mEq/L (ref 3.5–5.1)
Sodium: 134 mEq/L — ABNORMAL LOW (ref 135–145)

## 2012-07-08 LAB — CBC
Hemoglobin: 12.3 g/dL — ABNORMAL LOW (ref 13.0–17.0)
MCV: 87.7 fL (ref 78.0–100.0)
Platelets: 251 10*3/uL (ref 150–400)
RBC: 3.97 MIL/uL — ABNORMAL LOW (ref 4.22–5.81)
WBC: 10.3 10*3/uL (ref 4.0–10.5)

## 2012-07-08 LAB — TROPONIN I: Troponin I: <0.3 ng/mL (ref <0.30)

## 2012-07-08 MED ORDER — QUETIAPINE FUMARATE 25 MG PO TABS
25.0000 mg | ORAL_TABLET | Freq: Every day | ORAL | Status: DC
  Administered 2012-07-08 – 2012-07-12 (×5): 25 mg via ORAL
  Filled 2012-07-08 (×7): qty 1

## 2012-07-08 MED ORDER — AMLODIPINE BESYLATE 5 MG PO TABS: 5.0000 mg | ORAL_TABLET | Freq: Every day | ORAL | Status: DC

## 2012-07-08 MED ORDER — DILTIAZEM HCL 30 MG PO TABS
30.0000 mg | ORAL_TABLET | Freq: Four times a day (QID) | ORAL | Status: DC
  Administered 2012-07-08 – 2012-07-09 (×5): 30 mg via ORAL
  Filled 2012-07-08 (×10): qty 1

## 2012-07-08 MED ORDER — DONEPEZIL HCL 10 MG PO TABS
10.0000 mg | ORAL_TABLET | Freq: Every day | ORAL | Status: DC
  Administered 2012-07-08 – 2012-07-12 (×5): 10 mg via ORAL
  Filled 2012-07-08 (×6): qty 1

## 2012-07-08 MED ORDER — HALOPERIDOL LACTATE 5 MG/ML IJ SOLN
2.0000 mg | INTRAMUSCULAR | Status: DC | PRN
  Administered 2012-07-08 – 2012-07-10 (×4): 2 mg via INTRAVENOUS
  Filled 2012-07-08 (×2): qty 1
  Filled 2012-07-08: qty 7
  Filled 2012-07-08: qty 1

## 2012-07-08 NOTE — Progress Notes (Signed)
PT Cancellation Note  Patient Details Name: Walter Carey MRN: [id number] DOB: 07-14-1934   Cancelled Treatment:    Reason Eval/Treat Not Completed: Patient not medically ready (strict bedrest orders in place)   Fabio Asa 07/08/2012, 1:12 PM

## 2012-07-08 NOTE — Progress Notes (Signed)
  Echocardiogram 2D Echocardiogram has been performed.  Walter Carey FRANCES 07/08/2012, 6:41 PM

## 2012-07-08 NOTE — Consult Note (Signed)
CARDIOLOGY CONSULT NOTE  Patient ID: Walter Carey  MRN: [id number]  DOB: 1934/05/12  Admit date: 07/07/2012 Date of Consult: 07/08/2012  Primary Physician: Hollice Espy, MD Primary Cardiologist: none Primary Electrophysiologist:  none  Reason for Consultation: AVNRT management  History of Present Illness: Walter Carey is a 78 y.o. male with a history of dementia, hydrocephalus, HTN, who presented to the ED after multiple recent falls.  Please see H&P by Dr. Sunnie Nielsen for full details.  I was asked to see the patient to assist with AVNRT management.  See my initial note at 9:30pm for my initial recs. He returned to AVNRT again on diltiazem gtt, and ended up being admitted to the ICU.  Before we were able to push adenosine, he converted again to normal sinus rhythm, and has remained there with diltiazem gtt running.   Past Medical History  Diagnosis Date  . Hypertension   . Dementia   . Hydrocephalus with operating shunt   . Neuropathy   . Cancer     Past Surgical History  Procedure Laterality Date  . Carotid artery angioplasty       Home Meds: Prior to Admission medications   Medication Sig Start Date End Date Taking? Authorizing Provider  amitriptyline (ELAVIL) 25 MG tablet Take 25 mg by mouth at bedtime.   Yes Historical Provider, MD  amLODipine (NORVASC) 5 MG tablet Take 5 mg by mouth daily.   Yes Historical Provider, MD  B Complex-C (B-COMPLEX WITH VITAMIN C) tablet Take 1 tablet by mouth daily.   Yes Historical Provider, MD  CALCIUM-VITAMIN D PO Take 1 tablet by mouth 2 (two) times daily.   Yes Historical Provider, MD  Cholecalciferol (VITAMIN D-3) 1000 UNITS CAPS Take 1,000 Units by mouth 3 (three) times daily.   Yes Historical Provider, MD  donepezil (ARICEPT) 10 MG tablet Take 10 mg by mouth at bedtime.   Yes Historical Provider, MD  fish oil-omega-3 fatty acids 1000 MG capsule Take 1 g by mouth 2 (two) times daily.   Yes Historical Provider, MD  levothyroxine (SYNTHROID,  LEVOTHROID) 137 MCG tablet Take 137 mcg by mouth daily before breakfast.   Yes Historical Provider, MD  Multiple Vitamin (MULTIVITAMIN WITH MINERALS) TABS Take 1 tablet by mouth daily.   Yes Historical Provider, MD  omeprazole (PRILOSEC) 20 MG capsule Take 20 mg by mouth 2 (two) times daily.   Yes Historical Provider, MD  sertraline (ZOLOFT) 50 MG tablet Take 25 mg by mouth daily.   Yes Historical Provider, MD  VITAMIN E PO Take 1 capsule by mouth daily.   Yes Historical Provider, MD    Current Medications: Scheduled Meds: . amitriptyline  25 mg Oral QHS  . docusate sodium  100 mg Oral BID  . heparin  5,000 Units Subcutaneous Q8H  . levothyroxine  137 mcg Oral QAC breakfast  . pantoprazole  40 mg Oral Daily  . sertraline  25 mg Oral Daily  . sodium chloride  3 mL Intravenous Q12H  . sodium chloride  3 mL Intravenous Q12H   Continuous Infusions: . sodium chloride 75 mL/hr at 07/07/12 2359  . sodium chloride 75 mL/hr at 07/08/12 0000  . diltiazem (CARDIZEM) infusion 5 mg/hr (07/08/12 0000)   PRN Meds:.acetaminophen, acetaminophen, ondansetron (ZOFRAN) IV, ondansetron, traMADol    Allergies:    Allergies  Allergen Reactions  . Asa (Aspirin)   . Erythromycin   . Morphine And Related   . Oxycodone   . Stadol (Butorphanol)   .  Tylox (Oxycodone-Acetaminophen)   . Versed (Midazolam)     Social History:   The patient  reports that he has never smoked. He does not have any smokeless tobacco history on file. He reports that he does not drink alcohol or use illicit drugs.    Family History:   The patient's family history is not on file.   ROS:  He suffers from dementia and is unable to provide a reliable ROS  Vital Signs: Blood pressure 113/85, pulse 81, temperature 98.1 F (36.7 C), temperature source Oral, resp. rate 20, height 5\' 8"  (1.727 m), weight 80.7 kg (177 lb 14.6 oz), SpO2 91.00%.   PHYSICAL EXAM: General:  Elderly, in no acute distress HEENT: normal Lymph: no  adenopathy Neck: no JVD Endocrine:  No thryomegaly Vascular: No carotid bruits; FA pulses 2+ bilaterally without bruits Cardiac:  normal S1, S2; RRR; no murmur Lungs:  clear to auscultation bilaterally, no wheezing, rhonchi or rales Abd: soft, nontender, no hepatomegaly Ext: no edema Musculoskeletal:  No deformities, BUE and BLE strength normal and equal Skin: warm and dry Neuro:  CNs 2-12 intact, no focal abnormalities noted Psych:  Normal affect   EKG:  In AVNRT: regular, narrow complex tachycardia at 157 bpm. There are retrograde p waves seen during the tachycardia just after the QRS complex, most notably in lead III and V3/V4.   In NSR: Normal axis, normal R wave progression, no ST changes, ? Q waves inferiorly but hard to tell given poor baseline, occasional PACs   Labs:  Recent Labs  07/08/12 0100  TROPONINI <0.30   Lab Results  Component Value Date   WBC 10.3 07/08/2012   HGB 12.3* 07/08/2012   HCT 34.8* 07/08/2012   MCV 87.7 07/08/2012   PLT 251 07/08/2012    Recent Labs Lab 07/08/12 0100  NA 134*  K 4.1  CL 100  CO2 26  BUN 30*  CREATININE 2.09*  CALCIUM 9.0  GLUCOSE 110*   No results found for this basename: CHOL, HDL, LDLCALC, TRIG   No results found for this basename: DDIMER    Radiology/Studies:  Dg Chest 2 View  07/07/2012   *RADIOLOGY REPORT*  Clinical Data: Fall.  Hypoxia.  CHEST - 2 VIEW  Comparison: Two-view chest 04/03/2008.  Findings: Low lung volumes exaggerate the heart size.  Mild pulmonary vascular congestion is present.  No focal airspace consolidation is evident.  No significant effusions are present. Mild bibasilar atelectasis is noted.  A VP shunt catheter is in place.  IMPRESSION:  1.  Low lung volumes and mild pulmonary vascular congestion. 2.  No significant airspace consolidation.   Original Report Authenticated By: Marin Roberts, M.D.   Dg Lumbar Spine Complete  07/07/2012   *RADIOLOGY REPORT*  Clinical Data: Fall.  Low back pain.   LUMBAR SPINE - COMPLETE 4+ VIEW  Comparison: Lumbar spine radiographs 01/10/2008.  Findings: Transitional anatomy is again noted at L5/S1.  Vertebral body heights and alignment are maintained.  There is progressive loss of disc height at L3-4 with endplate changes.  Chronic loss of disc height at L4-5 is stable.  Mild facet degenerative changes are noted bilaterally.  A proximal left iliac artery stent is stable in position.  IMPRESSION:  1.  Transitional anatomy is again noted. 2.  Progressive vertebral body loss and endplate changes at L3-4. 3.  Stable chronic loss of disc height at L4-5. 4.  A proximal left iliac artery stent is in place.   Original Report Authenticated By: Cristal Deer  Alfredo Batty, M.D.   Ct Head Wo Contrast  07/07/2012   *RADIOLOGY REPORT*  Clinical Data: Fall  CT HEAD WITHOUT CONTRAST  Technique:  Contiguous axial images were obtained from the base of the skull through the vertex without contrast.  Comparison: Prior CT from 11/12/2010  Findings: Right frontal approach ventricular catheter terminates in the right lateral ventricle.  Overall ventricular size is not significantly changed as compared to the prior study. Scattered and confluent hypodensity within the periventricular white matter is consistent with chronic small vessel ischemic changes. Calcification within the cerebellar vermis is again noted, unchanged.  No acute intracranial hemorrhage or infarct.  No mass lesion or midline shift.  No extra-axial fluid collection.  Calvarium is intact.  Scattered mucosal thickening is present within the ethmoidal air cells.  Mastoid air cells are clear.  IMPRESSION:  1.   No significant interval change in hydrocephalus with cerebral atrophy and chronic small vessel ischemic changes.  Right ventricular catheter is stable in position with tip in the right lateral ventricle.  2.  No acute intracranial process.   Original Report Authenticated By: Rise Mu, M.D.    ASSESSMENT AND  PLAN:  1. AVNRT - He has responded well to Diltiazem gtt. - His blood pressure is well controlled. - Recommend stopping his home norvasc, and give him diltiazem PO instead as an outpatient.  - To transition off diltiazem drip: would give 30mg  of diltiazem q6hr.  Can stop diltiazem drip 30 minutes after first dose. - Titrate up diltiazem PO as needed if he has recurrence of AVNRT, max is 90mg  q6hrs. - Consolidate to long acting diltiazem on discharge.   Kelly Splinter 07/08/2012 2:03 AM

## 2012-07-08 NOTE — Progress Notes (Addendum)
[facility] Progress Note [place] TEAM 1 - Stepdown/ICU TEAM   Walter Carey NWG:[id number] DOB: Apr 11, 1934 DOA: 07/07/2012 PCP: Hollice Espy, MD  Brief narrative: Walter Carey is a 78 y.o. male with PMH significant for Hydrocephalus, HTN, who presents to ED complaining of back pain after a fall. Patient has fall more than 20 times per family. Patient get dizzy, and fall. Last fall was day prior to admission, he hit his head and back. He is complaining of back pain, worse left side thoracic area. He was notice to have HR 150, SVT. He denies chest pain or dyspnea. He is asymptomatic from tachycardia. He received 6 mg of adenosine without significant respond. He was started on IV Cardizem infusion and has converted back to NSR.    Assessment/Plan: Principal Problem:   AVNRT (AV nodal re-entry tachycardia) - resolved- will need EP consult if recurs - follow in SDU for 24 hrs to ensure no recurrence - cont PO Cardizem  Active Problems:   Hydrocephalus with operating shunt - recent falls but no worsening in hydrocephalus noted on CT head    Dementia - stable    Hypertension - stable -takes Norvasc at home but now on PO Cardizem    Falls - PT eval ordered-     CKD (chronic kidney disease), stage III Slightly worse than baseline Cr  (1.7) - cont to hydrate and follow    T11 vertebral fracture- 30% loss of height - possibly from recent fall but Delpozo also be chronic - per pt, pain is mild- if he proves to do [facility] with getting out of bed, will not further investigate this fracture- would only need vertebroplasty if severely acute and symptomatic- if pain is severe, next step would be to perform and MRI to determine age of fracture     Code Status: full code Family Communication: none Disposition Plan: follow in SDU- suspect he Lyerly need to go to rehab for falls  Consultants: Cardiology  Procedures: none  Antibiotics: none  DVT  prophylaxis: heparin  HPI/Subjective: Pt alert but confused- No significant complaints- states back pain is really mild-    Objective: Blood pressure 141/62, pulse 74, temperature 98.3 F (36.8 C), temperature source Oral, resp. rate 15, height 5\' 8"  (1.727 m), weight 80.7 kg (177 lb 14.6 oz), SpO2 94.00%.  Intake/Output Summary (Last 24 hours) at 07/08/12 0948 Last data filed at 07/08/12 0800  Gross per 24 hour  Intake 668.08 ml  Output      0 ml  Net 668.08 ml     Exam: General: Alert,  No acute respiratory distress Lungs: Clear to auscultation bilaterally without wheezes or crackles Cardiovascular: Regular rate and rhythm without murmur gallop or rub normal S1 and S2 Abdomen: Nontender, nondistended, soft, bowel sounds positive, no rebound, no ascites, no appreciable mass Extremities: No significant cyanosis, clubbing, or edema bilateral lower extremities  Data Reviewed: Basic Metabolic Panel:  Recent Labs Lab 07/07/12 1627 07/08/12 0100  NA 138 134*  K 4.3 4.1  CL 104 100  CO2  --  26  GLUCOSE 91 110*  BUN 31* 30*  CREATININE 2.30* 2.09*  CALCIUM  --  9.0  MG  --  1.9   Liver Function Tests: No results found for this basename: AST, ALT, ALKPHOS, BILITOT, PROT, ALBUMIN,  in the last 168 hours No results found for this basename: LIPASE, AMYLASE,  in the last 168 hours No results found for this basename: AMMONIA,  in the last 168  hours CBC:  Recent Labs Lab 07/07/12 1610 07/07/12 1627 07/08/12 0100  WBC 8.6  --  10.3  NEUTROABS 6.0  --   --   HGB 12.7* 12.9* 12.3*  HCT 35.7* 38.0* 34.8*  MCV 87.7  --  87.7  PLT 245  --  251   Cardiac Enzymes:  Recent Labs Lab 07/08/12 0100 07/08/12 0735  TROPONINI <0.30 <0.30   BNP (last 3 results) No results found for this basename: PROBNP,  in the last 8760 hours CBG: No results found for this basename: GLUCAP,  in the last 168 hours  Recent Results (from the past 240 hour(s))  MRSA PCR SCREENING      Status: None   Collection Time    07/07/12 11:27 PM      Result Value Range Status   MRSA by PCR NEGATIVE  NEGATIVE Final   Comment:            The GeneXpert MRSA Assay (FDA     approved for NASAL specimens     only), is one component of a     comprehensive MRSA colonization     surveillance program. It is not     intended to diagnose MRSA     infection nor to guide or     monitor treatment for     MRSA infections.     Studies:  Recent x-ray studies have been reviewed in detail by the Attending Physician  Scheduled Meds:  Scheduled Meds: . amitriptyline  25 mg Oral QHS  . diltiazem  30 mg Oral Q6H  . docusate sodium  100 mg Oral BID  . heparin  5,000 Units Subcutaneous Q8H  . levothyroxine  137 mcg Oral QAC breakfast  . pantoprazole  40 mg Oral Daily  . sertraline  25 mg Oral Daily  . sodium chloride  3 mL Intravenous Q12H  . sodium chloride  3 mL Intravenous Q12H   Continuous Infusions: . sodium chloride Stopped (07/08/12 0000)  . sodium chloride 75 mL/hr at 07/08/12 0600  . diltiazem (CARDIZEM) infusion Stopped (07/08/12 0330)    Time spent on care of this patient: 35 min   Bhavin Monjaraz, MD  [facility] Office  [phone] Pager - Text Page per Amion as per below:  On-Call/Text Page:      [email or web address]      password [email or web address]  If 7PM-7AM, please contact night-coverage [email or web address] Password [email or web address] 07/08/2012, 9:48 AM   LOS: 1 day

## 2012-07-08 NOTE — ED Provider Notes (Signed)
I saw and evaluated the patient, reviewed the resident's note and I agree with the findings and plan. I have reviewed EKG and agree with the resident interpretation.   Pt with mechanical fall due to not using his walker.  Larey Seat last night but states that his head doesn't feel right and prior hx of VP shunt due to hydrocephalus.  N/V intact on exam.  Family also states that urine smells strong.  Will check UA, i-stat, CBC and CXR to further eval.  Gwyneth Sprout, MD 07/08/12 [phone]

## 2012-07-08 NOTE — Progress Notes (Signed)
INITIAL NUTRITION ASSESSMENT  DOCUMENTATION CODES Per approved criteria  -Not Applicable   INTERVENTION: 1.  Supplements; Ensure Complete po BID, each supplement provides 350 kcal and 13 grams of protein. 2.  General healthful diet; provide assistance with meals as needed.  NUTRITION DIAGNOSIS: Inadequate oral intake related to confusion as evidenced by observation of intake at breakfast.   Monitor:  1.  Food/Beverage; pt meeting >/=90% estimated needs with tolerance. 2.  Wt/wt change; monitor trends  Reason for Assessment: Low Braden  78 y.o. male  Admitting Dx: AVNRT (AV nodal re-entry tachycardia)  ASSESSMENT: Pt admitted with back pain after fall.  Pt with increased recent falls, found to have SVT.  RD met with pt reports good intake at home, however is largely a poor historian due to dementia.  Pt with minimal intake of breakfast tray and has spilled a large of amount of his tray.  [name] need assistance with meals.   Nutrition Focused Physical Exam:  Subcutaneous Fat:  Orbital Region: wnl Upper Arm Region: wnl Thoracic and Lumbar Region: wnl  Muscle:  Temple Region: wnl Clavicle Bone Region: wnl Clavicle and Acromion Bone Region: n/a Scapular Bone Region: n/a Dorsal Hand: n/a Patellar Region: wnl Anterior Thigh Region: wnl Posterior Calf Region: n/a  Edema: none present  Height: Ht Readings from Last 1 Encounters:  07/07/12 5\' 8"  (1.727 m)  **Note loss in height r/t to vertebral fracture.   Weight: Wt Readings from Last 1 Encounters:  07/07/12 177 lb 14.6 oz (80.7 kg)    Ideal Body Weight: 70 kg  % Ideal Body Weight: 115%  Wt Readings from Last 10 Encounters:  07/07/12 177 lb 14.6 oz (80.7 kg)    Usual Body Weight: unknown, pt unable to state  BMI:  Body mass index is 27.06 kg/(m^2).  Estimated Nutritional Needs: Kcal: 1775-2000 Protein: 65-80g Fluid: ~1.8 L/day  Skin: intact  Diet Order: Cardiac  EDUCATION NEEDS: -No education needs  identified at this time   Intake/Output Summary (Last 24 hours) at 07/08/12 1114 Last data filed at 07/08/12 1000  Gross per 24 hour  Intake 818.08 ml  Output      0 ml  Net 818.08 ml    Last BM: PTA   Labs:   Recent Labs Lab 07/07/12 1627 07/08/12 0100  NA 138 134*  K 4.3 4.1  CL 104 100  CO2  --  26  BUN 31* 30*  CREATININE 2.30* 2.09*  CALCIUM  --  9.0  MG  --  1.9  GLUCOSE 91 110*    CBG (last 3)  No results found for this basename: GLUCAP,  in the last 72 hours  Scheduled Meds: . amitriptyline  25 mg Oral QHS  . diltiazem  30 mg Oral Q6H  . docusate sodium  100 mg Oral BID  . donepezil  10 mg Oral QHS  . heparin  5,000 Units Subcutaneous Q8H  . levothyroxine  137 mcg Oral QAC breakfast  . pantoprazole  40 mg Oral Daily  . sertraline  25 mg Oral Daily  . sodium chloride  3 mL Intravenous Q12H  . sodium chloride  3 mL Intravenous Q12H    Continuous Infusions: . sodium chloride Stopped (07/08/12 0000)  . sodium chloride 75 mL/hr at 07/08/12 0600    Past Medical History  Diagnosis Date  . Hypertension   . Dementia   . Hydrocephalus with operating shunt   . Neuropathy   . Cancer   . AVNRT (AV nodal re-entry  tachycardia)   . Falls   . CKD (chronic kidney disease), stage III     Past Surgical History  Procedure Laterality Date  . Carotid artery angioplasty      Loyce Dys, MS RD LDN Clinical Inpatient Dietitian Pager: [phone] Weekend/After hours pager: [phone]

## 2012-07-08 NOTE — Care Management Note (Addendum)
    Page 1 of 2   07/13/2012     4:37:28 PM   CARE MANAGEMENT NOTE 07/13/2012  Patient:  NICHOLS, CORTER   Account Number:  [id number]  Date Initiated:  07/08/2012  Documentation initiated by:  Junius Creamer  Subjective/Objective Assessment:   adm w svt     Action/Plan:   lives w wife, pcp dr Lupita Leash gates   Anticipated DC Date:     Anticipated DC Plan:        DC Planning Services  CM consult      Choice offered to / List presented to:          [facility] arranged  HH-1 RN  HH-10 DISEASE MANAGEMENT  HH-2 PT  HH-3 OT  HH-4 NURSE'S AIDE  HH-6 SOCIAL WORKER      HH agency  [facility]   Status of service:  Completed, signed off Medicare Important Message given?   (If response is "NO", the following Medicare IM given date fields will be blank) Date Medicare IM given:   Date Additional Medicare IM given:    Discharge Disposition:  HOME W HOME HEALTH SERVICES  Per UR Regulation:  Reviewed for med. necessity/level of care/duration of stay  If discussed at Long Length of Stay Meetings, dates discussed:   07/13/2012    Comments:  07-13-12  [address], [place] [phone] CM did speak to daughter and she wants [facility] services at this time. Family is unable to make a decision on SNF at this time. CM did make a referral for HHRN, PT, OT, Aide and SW. [facility] SW to assist with helping to place pt in a facility from home. daughter states that she will be able to assist in fathers care once he gets home. No further needs from CM at this time.   07-13-12 [address] [place], [place] [phone] CM called daughter Rosey Bath to see if she had come to any decision for disposition of her father. Daughter stated they were out looking at [facility] in [place].[facility], [facility] and [place]. CM did speak to daughter to see if she did not find a SNF that family agreed upon, would [facility] be an option. CM did try to contact daughter around 4:15 because a bed is  available at [facility]. CM will wait a little longer to see if daughter will call back. Gala Lewandowsky , [place] [phone]    07-12-12  [address], [place] [phone] Pt admitted with SVT placed on cardizem gtt. Had been monitoring BUN/CR and now has stabilized. Tweaking BP meds and the plan will be for SNF once medically stable.

## 2012-07-08 NOTE — Progress Notes (Signed)
Patient Name: Walter Carey Date of Encounter: 07/08/2012   Principal Problem:   Falls Active Problems:   AVNRT (AV nodal re-entry tachycardia)   Dementia   Hypertension   Hydrocephalus with operating shunt   CKD (chronic kidney disease), stage III   SUBJECTIVE  No chest pain, sob.  Doesn't know why he's in the hospital.  No recurrent SVT.  CURRENT MEDS . amitriptyline  25 mg Oral QHS  . diltiazem  30 mg Oral Q6H  . docusate sodium  100 mg Oral BID  . heparin  5,000 Units Subcutaneous Q8H  . levothyroxine  137 mcg Oral QAC breakfast  . pantoprazole  40 mg Oral Daily  . sertraline  25 mg Oral Daily  . sodium chloride  3 mL Intravenous Q12H  . sodium chloride  3 mL Intravenous Q12H   OBJECTIVE  Filed Vitals:   07/08/12 0300 07/08/12 0400 07/08/12 0500 07/08/12 0600  BP: 108/74 105/70 119/66 115/55  Pulse: 74 73 71 69  Temp:  98 F (36.7 C)    TempSrc:  Oral    Resp: 18 16 15 16   Height:      Weight:      SpO2: 100% 93% 94% 92%    Intake/Output Summary (Last 24 hours) at 07/08/12 0727 Last data filed at 07/08/12 0600  Gross per 24 hour  Intake 518.08 ml  Output      0 ml  Net 518.08 ml   Filed Weights   07/07/12 2316  Weight: 177 lb 14.6 oz (80.7 kg)   PHYSICAL EXAM  General: Pleasant, NAD. Neuro: Alert and oriented X 3. Moves all extremities spontaneously. Psych: Normal affect. HEENT:  Normal  Neck: Supple without bruits or JVD. Lungs:  Resp regular and unlabored, diminished breath sounds bilat. Heart: RRR no s3, s4, distant. Abdomen: Soft, non-tender, non-distended, BS + x 4.  Extremities: No clubbing, cyanosis or edema. DP/PT/Radials 1+ and equal bilaterally.  Accessory Clinical Findings  CBC  Recent Labs  07/07/12 1610 07/07/12 1627 07/08/12 0100  WBC 8.6  --  10.3  NEUTROABS 6.0  --   --   HGB 12.7* 12.9* 12.3*  HCT 35.7* 38.0* 34.8*  MCV 87.7  --  87.7  PLT 245  --  251   Basic Metabolic Panel  Recent Labs  07/07/12 1627  07/08/12 0100  NA 138 134*  K 4.3 4.1  CL 104 100  CO2  --  26  GLUCOSE 91 110*  BUN 31* 30*  CREATININE 2.30* 2.09*  CALCIUM  --  9.0  MG  --  1.9   Cardiac Enzymes  Recent Labs  07/08/12 0100  TROPONINI <0.30   TELE  rsr  ECG  Sinus tach, 107, left axis, no acute st/t changes.  ASSESSMENT AND PLAN  1.  SVT/AVNRT:  Resolved.  In sinus.  Lytes/Mg wnl.  Troponin nl. Cont PO dilt.  Check TSH (on synthroid)/echo.  2.  Falls:  Imaging non-acute.  Per IM.  3.  CKD III:  Stable.  Signed, Nicolasa Ducking NP  History and all data above reviewed.  Patient examined.  I agree with the findings as above.  Complains of back pain with inspiration.  RRR on PO Dilt. The patient exam reveals COR:RRR  ,  Lungs: Bilateral basilar crackles  ,  Abd: Positive bowel sounds, no rebound no guarding, Ext No edema  .  All available labs, radiology testing, previous records reviewed. Agree with documented assessment and plan.   Continue  Cardizem PO.  Echo pending.  Doubt any further work up.  We could consider EP study ablation if this is going to be a recurrent problem on meds.    Walter Carey  8:56 AM  07/08/2012

## 2012-07-09 LAB — URINE CULTURE: Culture: NO GROWTH

## 2012-07-09 LAB — BASIC METABOLIC PANEL
BUN: 29 mg/dL — ABNORMAL HIGH (ref 6–23)
CO2: 24 mEq/L (ref 19–32)
GFR calc non Af Amer: 35 mL/min — ABNORMAL LOW (ref >90)
Glucose, Bld: 108 mg/dL — ABNORMAL HIGH (ref 70–99)
Potassium: 3.4 mEq/L — ABNORMAL LOW (ref 3.5–5.1)

## 2012-07-09 MED ORDER — DILTIAZEM HCL 60 MG PO TABS
60.0000 mg | ORAL_TABLET | Freq: Four times a day (QID) | ORAL | Status: DC
  Administered 2012-07-09 – 2012-07-10 (×5): 60 mg via ORAL
  Filled 2012-07-09 (×8): qty 1

## 2012-07-09 NOTE — Progress Notes (Signed)
07/09/12 1352 07/09/12 1353  Vitals  BP 134/73 mmHg ! 83/59 mmHg  MAP (mmHg) 85 63  BP Location Left arm Left arm  BP Method Automatic Automatic  Patient Position, if appropriate Sitting Standing  Pulse Rate --  98  ECG Heart Rate 81 85  Ortostatic VS given to MD

## 2012-07-09 NOTE — Progress Notes (Signed)
Report from Night RN. Chart reviewed together. Handoff complete.Introductions complete. Will continue to monitor and advise attending as needed.   

## 2012-07-09 NOTE — Progress Notes (Signed)
[facility] Progress Note [place] TEAM 1 - Stepdown/ICU TEAM   Burk Hoctor Sena ZOX:[id number] DOB: 1934/06/04 DOA: 07/07/2012 PCP: Hollice Espy, MD  Brief narrative: Walter Carey is a 78 y.o. male with PMH significant for Hydrocephalus, HTN, who presents to ED complaining of back pain after a fall. Patient has fall more than 20 times per family. Patient get dizzy, and fall. Last fall was day prior to admission, he hit his head and back. He is complaining of back pain, worse left side thoracic area. He was notice to have HR 150, SVT. He denies chest pain or dyspnea. He is asymptomatic from tachycardia. He received 6 mg of adenosine without significant respond. He was started on IV Cardizem infusion and has converted back to NSR.    Assessment/Plan: Principal Problem:   AVNRT (AV nodal re-entry tachycardia) - Still having short burst/nonsustained tachycardia and therefore Cardizem has been increased by the cardiology group  Active Problems:   Hydrocephalus with operating shunt - recent falls but no worsening in hydrocephalus noted on CT head  Orthostatic hypotension -Patient noted to be dizzy when standing up to work with physical therapy today -Orthostatic vitals performed in blood pressure noted to drop significantly with standing in addition to the fact that patient is symptomatic -We'll continue to hydrate -This [place] be the reason that the patient has been falling at home    Dementia with behavioral disturbances in the evening likely due to sundowning -When necessary Haldol -Added a low-dose cerebral at bedtime which did help him sleep last night - stable    Hypertension - stable -takes Norvasc at home but now on PO Cardizem    Falls - PT eval ordered-     CKD (chronic kidney disease), stage III Slightly worse than baseline Cr  (1.7) - cont to hydrate and follow    T11 vertebral fracture- 30% loss of height - possibly from recent fall but [place] also be chronic - per  pt, pain is mild- if he proves to do [facility] with getting out of bed, will not further investigate this fracture- would  need vertebroplasty if fracture is acute and patient is symptomatic- if pain is severe, next step would be to perform an MRI to determine age of fracture    Code Status: full code Family Communication: Discussed with daughter and son-in-law Disposition Plan: follow in SDU- suspect he Monnig need to go to rehab for falls  Consultants: Cardiology  Procedures: none  Antibiotics: none  DVT prophylaxis: heparin  HPI/Subjective: Pt alert but confused- he does admit to having back pain today-   Objective: Blood pressure 83/59, pulse 98, temperature 98 F (36.7 C), temperature source Oral, resp. rate 21, height 5\' 8"  (1.727 m), weight 81.2 kg (179 lb 0.2 oz), SpO2 100.00%.  Intake/Output Summary (Last 24 hours) at 07/09/12 1417 Last data filed at 07/09/12 1400  Gross per 24 hour  Intake   1207 ml  Output    350 ml  Net    857 ml     Exam: General: Alert,  No acute respiratory distress Lungs: Clear to auscultation bilaterally without wheezes or crackles Cardiovascular: Regular rate and rhythm without murmur gallop or rub normal S1 and S2 Abdomen: Nontender, nondistended, soft, bowel sounds positive, no rebound, no ascites, no appreciable mass Extremities: No significant cyanosis, clubbing, or edema bilateral lower extremities Back: Tenderness to noted in the spine at level of T11  Data Reviewed: Basic Metabolic Panel:  Recent Labs Lab 07/07/12 1627 07/08/12  0100 07/09/12 0505  NA 138 134* 136  K 4.3 4.1 3.4*  CL 104 100 103  CO2  --  26 24  GLUCOSE 91 110* 108*  BUN 31* 30* 29*  CREATININE 2.30* 2.09* 1.80*  CALCIUM  --  9.0 8.5  MG  --  1.9  --    Liver Function Tests: No results found for this basename: AST, ALT, ALKPHOS, BILITOT, PROT, ALBUMIN,  in the last 168 hours No results found for this basename: LIPASE, AMYLASE,  in the last 168 hours No  results found for this basename: AMMONIA,  in the last 168 hours CBC:  Recent Labs Lab 07/07/12 1610 07/07/12 1627 07/08/12 0100  WBC 8.6  --  10.3  NEUTROABS 6.0  --   --   HGB 12.7* 12.9* 12.3*  HCT 35.7* 38.0* 34.8*  MCV 87.7  --  87.7  PLT 245  --  251   Cardiac Enzymes:  Recent Labs Lab 07/08/12 0100 07/08/12 0735 07/08/12 1209  TROPONINI <0.30 <0.30 <0.30   BNP (last 3 results) No results found for this basename: PROBNP,  in the last 8760 hours CBG: No results found for this basename: GLUCAP,  in the last 168 hours  Recent Results (from the past 240 hour(s))  URINE CULTURE     Status: None   Collection Time    07/07/12  6:55 PM      Result Value Range Status   Specimen Description URINE, RANDOM   Final   Special Requests ADDED [id number]   Final   Culture  Setup Time 07/07/2012 23:26   Final   Colony Count NO GROWTH   Final   Culture NO GROWTH   Final   Report Status 07/09/2012 FINAL   Final  MRSA PCR SCREENING     Status: None   Collection Time    07/07/12 11:27 PM      Result Value Range Status   MRSA by PCR NEGATIVE  NEGATIVE Final   Comment:            The GeneXpert MRSA Assay (FDA     approved for NASAL specimens     only), is one component of a     comprehensive MRSA colonization     surveillance program. It is not     intended to diagnose MRSA     infection nor to guide or     monitor treatment for     MRSA infections.     Studies:  Recent x-ray studies have been reviewed in detail by the Attending Physician  Scheduled Meds:  Scheduled Meds: . amitriptyline  25 mg Oral QHS  . diltiazem  60 mg Oral Q6H  . docusate sodium  100 mg Oral BID  . donepezil  10 mg Oral QHS  . heparin  5,000 Units Subcutaneous Q8H  . levothyroxine  137 mcg Oral QAC breakfast  . pantoprazole  40 mg Oral Daily  . QUEtiapine  25 mg Oral QHS  . sertraline  25 mg Oral Daily  . sodium chloride  3 mL Intravenous Q12H  . sodium chloride  3 mL Intravenous Q12H    Continuous Infusions: . sodium chloride Stopped (07/08/12 0000)  . sodium chloride 75 mL/hr at 07/09/12 1400    Time spent on care of this patient: 35 min   Calvert Cantor, MD  [facility] Office  [phone] Pager - Text Page per Loretha Stapler as per below:  On-Call/Text Page:      [email or web address]  password [email or web address]  If 7PM-7AM, please contact night-coverage [email or web address] Password [email or web address] 07/09/2012, 2:17 PM   LOS: 2 days

## 2012-07-09 NOTE — Progress Notes (Signed)
   Consulting cardiologist: Dr. Rollene Rotunda  Subjective:   No chest pain or palpitations.   Objective:   Temp:  [97.7 F (36.5 C)-98.4 F (36.9 C)] 97.7 F (36.5 C) (07/04 0800) Pulse Rate:  [73-104] 73 (07/04 0600) Resp:  [10-22] 10 (07/04 0600) BP: (115-135)/(63-83) 127/63 mmHg (07/04 0600) SpO2:  [87 %-96 %] 96 % (07/04 0350) Weight:  [179 lb 0.2 oz (81.2 kg)] 179 lb 0.2 oz (81.2 kg) (07/04 0600)    Filed Weights   07/07/12 2316 07/09/12 0600  Weight: 177 lb 14.6 oz (80.7 kg) 179 lb 0.2 oz (81.2 kg)    Intake/Output Summary (Last 24 hours) at 07/09/12 0839 Last data filed at 07/09/12 0700  Gross per 24 hour  Intake    832 ml  Output    200 ml  Net    632 ml   Telemetry: Short bursts of SVT but none sustained.  Exam:  General: No distress.  Lungs: Diminished breath sounds.  Cardiac: RRR, no gallop.  Extremities: No pitting.  Lab Results:  Basic Metabolic Panel:  Recent Labs Lab 07/07/12 1627 07/08/12 0100 07/09/12 0505  NA 138 134* 136  K 4.3 4.1 3.4*  CL 104 100 103  CO2  --  26 24  GLUCOSE 91 110* 108*  BUN 31* 30* 29*  CREATININE 2.30* 2.09* 1.80*  CALCIUM  --  9.0 8.5  MG  --  1.9  --     CBC:  Recent Labs Lab 07/07/12 1610 07/07/12 1627 07/08/12 0100  WBC 8.6  --  10.3  HGB 12.7* 12.9* 12.3*  HCT 35.7* 38.0* 34.8*  MCV 87.7  --  87.7  PLT 245  --  251    Cardiac Enzymes:  Recent Labs Lab 07/08/12 0100 07/08/12 0735 07/08/12 1209  TROPONINI <0.30 <0.30 <0.30    Medications:   Scheduled Medications: . amitriptyline  25 mg Oral QHS  . diltiazem  30 mg Oral Q6H  . docusate sodium  100 mg Oral BID  . donepezil  10 mg Oral QHS  . heparin  5,000 Units Subcutaneous Q8H  . levothyroxine  137 mcg Oral QAC breakfast  . pantoprazole  40 mg Oral Daily  . QUEtiapine  25 mg Oral QHS  . sertraline  25 mg Oral Daily  . sodium chloride  3 mL Intravenous Q12H  . sodium chloride  3 mL Intravenous Q12H     Infusions: .  sodium chloride Stopped (07/08/12 0000)  . sodium chloride 75 mL/hr at 07/08/12 0600     PRN Medications:  acetaminophen, acetaminophen, haloperidol lactate, ondansetron (ZOFRAN) IV, ondansetron, traMADol   Assessment:   1. PSVT - AVNRT. No sustained events but very frequent ectopy and brief runs noted.  2. CKD, stage III.  3. Dementia, frequent falls.   Plan/Discussion:    Will try and increase cardizem to 60 mg PO Q6 hours. Echocardiogram pending.   Jonelle Sidle, M.D., F.A.C.C.

## 2012-07-09 NOTE — Evaluation (Signed)
Physical Therapy Evaluation Patient Details Name: Walter Carey Born MRN: [id number] DOB: December 01, 1934 Today's Date: 07/09/2012 Time: 4098-1191 PT Time Calculation (min): 23 min  PT Assessment / Plan / Recommendation History of Present Illness  Multiple falls, Hydrocephalus, T11 veterbal fx  Clinical Impression  Walter Carey is a 78 y.o. male with PMH significant for Hydrocephalus, HTN, who presents to ED complaining of back pain after a fall. Patient has fall more than 20 times per family. Patient get dizzy, and fall. Last fall was day prior to admission, he hit his head and back. He is complaining of back pain, worse left side thoracic area   Pt did report dizziness upon standing on evaluation with mild decrease in BP.  Difficult to assess orthostatics due to pt unable to stand long enough to take BP however pt does report of dizziness and wanting to return to sitting.  Pt's BP did decrease from EOB to sitting in recliner.  See in vitals section.  Pt will benefit from acute PT services to improve overall mobility and prepare for safe d/c to next venue.     PT Assessment  Patient needs continued PT services    Follow Up Recommendations  SNF    Equipment Recommendations  None recommended by PT    Recommendations for Other Services     Frequency Min 3X/week    Precautions / Restrictions Precautions Precautions: Fall;Back   Pertinent Vitals/Pain Sitting EOB - 135/79 Sitting in recliner after transfer- 113/85      Mobility  Bed Mobility Bed Mobility: Supine to Sit;Sitting - Scoot to Edge of Bed Supine to Sit: 1: +2 Total assist;With rails Supine to Sit: Patient Percentage: 40% Sitting - Scoot to Edge of Bed: 1: +2 Total assist Sitting - Scoot to Edge of Bed: Patient Percentage: 50% Details for Bed Mobility Assistance: +2 (A) to elevate trunk OOB.  Pt continues to go into trunk extension with any mobility Transfers Transfers: Sit to Stand;Stand to Sit;Stand Pivot Transfers Sit to  Stand: 1: +2 Total assist;From bed;From elevated surface Sit to Stand: Patient Percentage: 40% Stand to Sit: 1: +2 Total assist;To chair/3-in-1 Stand to Sit: Patient Percentage: 50% Stand Pivot Transfers: 1: +2 Total assist Stand Pivot Transfers: Patient Percentage: 50% Details for Transfer Assistance: +2 (A) to initiate transfer with manual cues for forward flexion.  Pt continues to go into trunk extension when attempting to perform sit > stand.  Pt needs manual cues to advance hips to recliner. Ambulation/Gait Ambulation/Gait Assistance: Not tested (comment)    Exercises     PT Diagnosis: Difficulty walking;Generalized weakness  PT Problem List: Decreased strength;Decreased activity tolerance;Decreased balance;Decreased mobility;Decreased coordination;Decreased cognition;Decreased knowledge of use of DME;Decreased safety awareness;Pain PT Treatment Interventions: DME instruction;Gait training;Functional mobility training;Therapeutic activities;Therapeutic exercise;Balance training;Neuromuscular re-education;Cognitive remediation     PT Goals(Current goals can be found in the care plan section) Acute Rehab PT Goals Patient Stated Goal: did not set PT Goal Formulation: Patient unable to participate in goal setting Time For Goal Achievement: 07/23/12 Potential to Achieve Goals: Fair  Visit Information  Last PT Received On: 07/09/12 History of Present Illness: Multiple falls, Hydrocephalus, T11 veterbal fx       Prior Functioning  Home Living Family/patient expects to be discharged to:: Private residence Living Arrangements: Spouse/significant other Additional Comments: Pt poor historian and no family present at evaluation Prior Function Comments: Will need to further assess no family present Communication Communication: No difficulties Dominant Hand: Right    Cognition  Cognition Arousal/Alertness: Awake/alert  Behavior During Therapy: Flat affect Overall Cognitive Status:  No family/caregiver present to determine baseline cognitive functioning Memory: Decreased short-term memory    Extremity/Trunk Assessment Lower Extremity Assessment Lower Extremity Assessment: Generalized weakness   Balance Balance Balance Assessed: Yes Static Sitting Balance Static Sitting - Balance Support: Feet supported Static Sitting - Level of Assistance: 4: Min assist;3: Mod assist Static Sitting - Comment/# of Minutes: ~ 5 minutes prior to transfers.  Pt with posterior lean and cues to promote upright posture and midline Static Standing Balance Static Standing - Balance Support: Bilateral upper extremity supported Static Standing - Level of Assistance: 1: +2 Total assist  End of Session PT - End of Session Equipment Utilized During Treatment: Gait belt;Oxygen (2L) Activity Tolerance: Patient limited by fatigue;Other (comment) (limited due c/o dizziness) Patient left: in chair;with call bell/phone within reach Nurse Communication: Mobility status  GP     Ephraim Reichel 07/09/2012, 11:19 AM Jake Shark, PT DPT [phone]

## 2012-07-10 ENCOUNTER — Inpatient Hospital Stay (HOSPITAL_COMMUNITY): Payer: Medicare Other

## 2012-07-10 DIAGNOSIS — W19XXXS Unspecified fall, sequela: Secondary | ICD-10-CM

## 2012-07-10 DIAGNOSIS — F0391 Unspecified dementia with behavioral disturbance: Secondary | ICD-10-CM

## 2012-07-10 LAB — BASIC METABOLIC PANEL
BUN: 20 mg/dL (ref 6–23)
Chloride: 106 mEq/L (ref 96–112)
Creatinine, Ser: 1.68 mg/dL — ABNORMAL HIGH (ref 0.50–1.35)
GFR calc Af Amer: 44 mL/min — ABNORMAL LOW (ref >90)
Glucose, Bld: 99 mg/dL (ref 70–99)

## 2012-07-10 LAB — CBC
HCT: 33.1 % — ABNORMAL LOW (ref 39.0–52.0)
Hemoglobin: 11.6 g/dL — ABNORMAL LOW (ref 13.0–17.0)
MCH: 30.8 pg (ref 26.0–34.0)
MCHC: 35 g/dL (ref 30.0–36.0)
MCV: 87.8 fL (ref 78.0–100.0)
RDW: 12.1 % (ref 11.5–15.5)

## 2012-07-10 MED ORDER — LORAZEPAM 2 MG/ML IJ SOLN
1.0000 mg | INTRAMUSCULAR | Status: DC | PRN
  Administered 2012-07-10 (×2): 1 mg via INTRAVENOUS
  Administered 2012-07-10: 12:00:00 via INTRAVENOUS
  Administered 2012-07-11 (×3): 1 mg via INTRAVENOUS
  Filled 2012-07-10 (×7): qty 1

## 2012-07-10 MED ORDER — DILTIAZEM HCL ER COATED BEADS 240 MG PO CP24
240.0000 mg | ORAL_CAPSULE | Freq: Every day | ORAL | Status: DC
  Administered 2012-07-10 – 2012-07-13 (×4): 240 mg via ORAL
  Filled 2012-07-10 (×4): qty 1

## 2012-07-10 MED ORDER — FUROSEMIDE 40 MG PO TABS
40.0000 mg | ORAL_TABLET | Freq: Once | ORAL | Status: AC
  Administered 2012-07-10: 40 mg via ORAL
  Filled 2012-07-10: qty 1

## 2012-07-10 MED ORDER — HALOPERIDOL LACTATE 5 MG/ML IJ SOLN
2.0000 mg | INTRAMUSCULAR | Status: DC | PRN
  Administered 2012-07-10 – 2012-07-12 (×4): 2 mg via INTRAVENOUS
  Filled 2012-07-10 (×2): qty 1
  Filled 2012-07-10 (×3): qty 0.8

## 2012-07-10 NOTE — Progress Notes (Signed)
[facility] Progress Note [place] TEAM 1 - Stepdown/ICU TEAM   Walter Carey ZOX:[id number] DOB: 02-Apr-1934 DOA: 07/07/2012 PCP: Walter Espy, MD  Brief narrative: Walter Carey is a 78 y.o. male with PMH significant for Hydrocephalus, HTN, who presents to ED complaining of back pain after a fall. Patient has fall more than 20 times per family. Patient get dizzy, and fall. Last fall was day prior to admission, he hit his head and back. He is complaining of back pain, worse left side thoracic area. He was notice to have HR 150, SVT. He denies chest pain or dyspnea. He is asymptomatic from tachycardia. He received 6 mg of adenosine without significant respond. He was started on IV Cardizem infusion and has converted back to NSR.    Assessment/Plan: Principal Problem:   AVNRT (AV nodal re-entry tachycardia) - Cardizem has been increased by the cardiology group- tachycardia improving- no further recommendations by Cars- outpt f/u  Active Problems:   Hydrocephalus with operating shunt - recent falls but no worsening in hydrocephalus noted on CT head  Orthostatic hypotension -Patient noted to be dizzy when standing up to work with physical therapy today -Orthostatic vitals performed in blood pressure noted to drop significantly with standing in addition to the fact that patient is symptomatic -Hydrated- no longer orthostatic as of this morning -This [place] be the reason that the patient has been falling at home    Dementia with behavioral disturbances in the evening likely due to sundowning -When necessary Haldol or Ativan -Added a low-dose Seroquel at bedtime which did help him sleep last night - stable    Hypertension - stable -takes Norvasc at home but now on PO Cardizem    Falls - PT eval ordered-     CKD (chronic kidney disease), stage III Slightly worse than baseline Cr  (1.7) - cont to hydrate and follow    T11 vertebral fracture- 30% loss of height - possibly from  recent fall but [place] also be chronic - per pt, pain is mild- if he proves to do [facility] with getting out of bed, will not further investigate this fracture- would  need vertebroplasty if fracture is acute and patient is symptomatic- if pain is severe, next step would be to perform an MRI to determine age of fracture    Code Status: full code Family Communication: Discussed with daughter and son-in-law Disposition Plan: follow in SDU- will need to go to rehab facility  Consultants: Cardiology  Procedures: none  Antibiotics: none  DVT prophylaxis: heparin  HPI/Subjective: Pt alert but confused-  back pain is mild today-   Objective: Blood pressure 151/61, pulse 38, temperature 97.6 F (36.4 C), temperature source Oral, resp. rate 30, height 5\' 8"  (1.727 m), weight 81.3 kg (179 lb 3.7 oz), SpO2 97.00%.  Intake/Output Summary (Last 24 hours) at 07/10/12 1544 Last data filed at 07/10/12 0800  Gross per 24 hour  Intake   1875 ml  Output    250 ml  Net   1625 ml     Exam: General: Alert,  No acute respiratory distress Lungs: Clear to auscultation bilaterally without wheezes or crackles Cardiovascular: Regular rate and rhythm without murmur gallop or rub normal S1 and S2 Abdomen: Nontender, nondistended, soft, bowel sounds positive, no rebound, no ascites, no appreciable mass Extremities: No significant cyanosis, clubbing, or edema bilateral lower extremities Back: Tenderness to noted in the spine at level of T11  Data Reviewed: Basic Metabolic Panel:  Recent Labs Lab 07/07/12  1627 07/08/12 0100 07/09/12 0505 07/10/12 0430  NA 138 134* 136 139  K 4.3 4.1 3.4* 3.6  CL 104 100 103 106  CO2  --  26 24 25   GLUCOSE 91 110* 108* 99  BUN 31* 30* 29* 20  CREATININE 2.30* 2.09* 1.80* 1.68*  CALCIUM  --  9.0 8.5 8.7  MG  --  1.9  --   --    Liver Function Tests: No results found for this basename: AST, ALT, ALKPHOS, BILITOT, PROT, ALBUMIN,  in the last 168 hours No results  found for this basename: LIPASE, AMYLASE,  in the last 168 hours No results found for this basename: AMMONIA,  in the last 168 hours CBC:  Recent Labs Lab 07/07/12 1610 07/07/12 1627 07/08/12 0100 07/10/12 0430  WBC 8.6  --  10.3 7.2  NEUTROABS 6.0  --   --   --   HGB 12.7* 12.9* 12.3* 11.6*  HCT 35.7* 38.0* 34.8* 33.1*  MCV 87.7  --  87.7 87.8  PLT 245  --  251 231   Cardiac Enzymes:  Recent Labs Lab 07/08/12 0100 07/08/12 0735 07/08/12 1209  TROPONINI <0.30 <0.30 <0.30   BNP (last 3 results) No results found for this basename: PROBNP,  in the last 8760 hours CBG: No results found for this basename: GLUCAP,  in the last 168 hours  Recent Results (from the past 240 hour(s))  URINE CULTURE     Status: None   Collection Time    07/07/12  6:55 PM      Result Value Range Status   Specimen Description URINE, RANDOM   Final   Special Requests ADDED [id number]   Final   Culture  Setup Time 07/07/2012 23:26   Final   Colony Count NO GROWTH   Final   Culture NO GROWTH   Final   Report Status 07/09/2012 FINAL   Final  MRSA PCR SCREENING     Status: None   Collection Time    07/07/12 11:27 PM      Result Value Range Status   MRSA by PCR NEGATIVE  NEGATIVE Final   Comment:            The GeneXpert MRSA Assay (FDA     approved for NASAL specimens     only), is one component of a     comprehensive MRSA colonization     surveillance program. It is not     intended to diagnose MRSA     infection nor to guide or     monitor treatment for     MRSA infections.     Studies:  Recent x-ray studies have been reviewed in detail by the Attending Physician  Scheduled Meds:  Scheduled Meds: . amitriptyline  25 mg Oral QHS  . diltiazem  60 mg Oral Q6H  . docusate sodium  100 mg Oral BID  . donepezil  10 mg Oral QHS  . heparin  5,000 Units Subcutaneous Q8H  . levothyroxine  137 mcg Oral QAC breakfast  . pantoprazole  40 mg Oral Daily  . QUEtiapine  25 mg Oral QHS  .  sertraline  25 mg Oral Daily  . sodium chloride  3 mL Intravenous Q12H  . sodium chloride  3 mL Intravenous Q12H   Continuous Infusions:    Time spent on care of this patient: 35 min   Wilfrido Luedke, MD  [facility] Office  [phone] Pager - Text Page per Loretha Stapler as per below:  On-Call/Text Page:      [email or web address]      password [email or web address]  If 7PM-7AM, please contact night-coverage [email or web address] Password [email or web address] 07/10/2012, 3:44 PM   LOS: 3 days

## 2012-07-10 NOTE — Progress Notes (Signed)
Report from Night RN. Chart reviewed together. Handoff complete.Introductions complete. Will continue to monitor and advise attending as needed.   

## 2012-07-10 NOTE — Progress Notes (Signed)
Patient ID: Kabe Mckoy Patchin, male   DOB: November 12, 1934, 78 y.o.   MRN: [id number]   Consulting cardiologist: Dr. Rollene Rotunda  Subjective:   Unsettled at night No palpitations   Objective:   Temp:  [97.6 F (36.4 C)-98.4 F (36.9 C)] 97.6 F (36.4 C) (07/05 0811) Pulse Rate:  [81-125] 89 (07/05 0908) Resp:  [12-33] 30 (07/05 0908) BP: (83-170)/(52-125) 147/125 mmHg (07/05 0908) SpO2:  [91 %-100 %] 95 % (07/05 0908) Weight:  [179 lb 3.7 oz (81.3 kg)] 179 lb 3.7 oz (81.3 kg) (07/05 0500)    Filed Weights   07/07/12 2316 07/09/12 0600 07/10/12 0500  Weight: 177 lb 14.6 oz (80.7 kg) 179 lb 0.2 oz (81.2 kg) 179 lb 3.7 oz (81.3 kg)    Intake/Output Summary (Last 24 hours) at 07/10/12 0956 Last data filed at 07/10/12 0800  Gross per 24 hour  Intake   2085 ml  Output    500 ml  Net   1585 ml   Telemetry: One short burst SVT   Exam:  General: No distress.  Lungs: Diminished breath sounds.  Cardiac: RRR, no gallop.  Extremities: No pitting.  Lab Results:  Basic Metabolic Panel:  Recent Labs Lab 07/07/12 1627 07/08/12 0100 07/09/12 0505 07/10/12 0430  NA 138 134* 136 139  K 4.3 4.1 3.4* 3.6  CL 104 100 103 106  CO2  --  26 24 25   GLUCOSE 91 110* 108* 99  BUN 31* 30* 29* 20  CREATININE 2.30* 2.09* 1.80* 1.68*  CALCIUM  --  9.0 8.5 8.7  MG  --  1.9  --   --     CBC:  Recent Labs Lab 07/07/12 1610 07/07/12 1627 07/08/12 0100 07/10/12 0430  WBC 8.6  --  10.3 7.2  HGB 12.7* 12.9* 12.3* 11.6*  HCT 35.7* 38.0* 34.8* 33.1*  MCV 87.7  --  87.7 87.8  PLT 245  --  251 231    Cardiac Enzymes:  Recent Labs Lab 07/08/12 0100 07/08/12 0735 07/08/12 1209  TROPONINI <0.30 <0.30 <0.30    Medications:   Scheduled Medications: . amitriptyline  25 mg Oral QHS  . diltiazem  60 mg Oral Q6H  . docusate sodium  100 mg Oral BID  . donepezil  10 mg Oral QHS  . heparin  5,000 Units Subcutaneous Q8H  . levothyroxine  137 mcg Oral QAC breakfast  . pantoprazole  40  mg Oral Daily  . QUEtiapine  25 mg Oral QHS  . sertraline  25 mg Oral Daily  . sodium chloride  3 mL Intravenous Q12H  . sodium chloride  3 mL Intravenous Q12H    Infusions:    PRN Medications: acetaminophen, acetaminophen, haloperidol lactate, ondansetron (ZOFRAN) IV, ondansetron, traMADol   Assessment:   1. PSVT - AVNRT. Limited continue PO cardizem   2. CKD, stage III.  3. Dementia, frequent falls.   Plan/Discussion:    Will try and increase cardizem to 60 mg PO Q6 hours. Echocardiogram is essentially normal with EF 50-55% and mild MR.  ECG review today SR rate 86 PAC.  Postural symptoms related to polypharmacy.  Need to simplify CNS active drugs    Will sign off   Charlton Haws

## 2012-07-10 NOTE — Progress Notes (Signed)
Paged Md for restraint renewal order.   Pt up to chair and getting restless pulling at mittens and tubings.  Re-educated pt to leave on mitten and not to pull at lines.  Will give haldol and continue to monitor. Walter Carey

## 2012-07-11 NOTE — Progress Notes (Addendum)
[facility] Progress Note [place] TEAM 1 - Stepdown/ICU TEAM   Robertlee Rogacki Stiger ZOX:[id number] DOB: 1934-07-22 DOA: 07/07/2012 PCP: Hollice Espy, MD  Brief narrative: Reznor Ferrando Shirkey is a 78 y.o. male with PMH significant for Hydrocephalus, HTN, who presents to ED complaining of back pain after a fall. Patient has fall more than 20 times per family. Patient get dizzy, and fall. Last fall was day prior to admission, he hit his head and back. He is complaining of back pain, worse left side thoracic area. He was notice to have HR 150, SVT. He denies chest pain or dyspnea. He is asymptomatic from tachycardia. He received 6 mg of adenosine without significant respond. He was started on IV Cardizem infusion and has converted back to NSR.    Assessment/Plan: Principal Problem:   AVNRT (AV nodal re-entry tachycardia) - Cardizem has been increased by the cardiology group- tachycardia improving-I have changed from short acting to long acting Cardizem - no further recommendations by Cars- outpt f/u  - will transfer out of SDU  Active Problems:   Hydrocephalus with operating shunt - recent falls but no worsening in hydrocephalus noted on CT head  Orthostatic hypotension -Patient noted to be dizzy when standing up to work with physical therapy today -Orthostatic vitals performed in blood pressure noted to drop significantly with standing in addition to the fact that patient is symptomatic -Hydrated- no longer orthostatic  -This Kimbell be the reason that the patient has been falling at home    Dementia with behavioral disturbances in the evening likely due to sundowning -When necessary Haldol or Ativan -Added a low-dose Seroquel at bedtime which did help him sleep    Hypertension - stable -takes Norvasc at home but now on PO Cardizem    Falls - PT eval ordered-     CKD (chronic kidney disease), stage III Slightly worse than baseline Cr  (1.7) on admission- now improved after hydration -     T11 vertebral fracture- 30% loss of height - possibly from recent fall but Prosser also be chronic - per pt, pain is mild- if he proves to do [facility] with getting out of bed, will not further investigate this fracture- would  need vertebroplasty if fracture is acute and patient is symptomatic- if pain is severe, next step would be to perform an MRI to determine age of fracture    Code Status: full code Family Communication: Discussed with daughter and son-in-law Disposition Plan: transfer to med/surg- will need to go to rehab facility  Consultants: Cardiology  Procedures: none  Antibiotics: none  DVT prophylaxis: heparin  HPI/Subjective: Pt alert but confused- no complaints today   Objective: Blood pressure 139/84, pulse 82, temperature 97.8 F (36.6 C), temperature source Oral, resp. rate 18, height 5\' 8"  (1.727 m), weight 80.6 kg (177 lb 11.1 oz), SpO2 95.00%.  Intake/Output Summary (Last 24 hours) at 07/11/12 1446 Last data filed at 07/11/12 1400  Gross per 24 hour  Intake    480 ml  Output   1100 ml  Net   -620 ml     Exam: General: Alert,  No acute respiratory distress Lungs: Clear to auscultation bilaterally without wheezes or crackles Cardiovascular: Regular rate and rhythm without murmur gallop or rub normal S1 and S2 Abdomen: Nontender, nondistended, soft, bowel sounds positive, no rebound, no ascites, no appreciable mass Extremities: No significant cyanosis, clubbing, or edema bilateral lower extremities Back: Tenderness to noted in the spine at level of T11  Data Reviewed:  Basic Metabolic Panel:  Recent Labs Lab 07/07/12 1627 07/08/12 0100 07/09/12 0505 07/10/12 0430  NA 138 134* 136 139  K 4.3 4.1 3.4* 3.6  CL 104 100 103 106  CO2  --  26 24 25   GLUCOSE 91 110* 108* 99  BUN 31* 30* 29* 20  CREATININE 2.30* 2.09* 1.80* 1.68*  CALCIUM  --  9.0 8.5 8.7  MG  --  1.9  --   --    Liver Function Tests: No results found for this basename: AST, ALT,  ALKPHOS, BILITOT, PROT, ALBUMIN,  in the last 168 hours No results found for this basename: LIPASE, AMYLASE,  in the last 168 hours No results found for this basename: AMMONIA,  in the last 168 hours CBC:  Recent Labs Lab 07/07/12 1610 07/07/12 1627 07/08/12 0100 07/10/12 0430  WBC 8.6  --  10.3 7.2  NEUTROABS 6.0  --   --   --   HGB 12.7* 12.9* 12.3* 11.6*  HCT 35.7* 38.0* 34.8* 33.1*  MCV 87.7  --  87.7 87.8  PLT 245  --  251 231   Cardiac Enzymes:  Recent Labs Lab 07/08/12 0100 07/08/12 0735 07/08/12 1209  TROPONINI <0.30 <0.30 <0.30   BNP (last 3 results) No results found for this basename: PROBNP,  in the last 8760 hours CBG: No results found for this basename: GLUCAP,  in the last 168 hours  Recent Results (from the past 240 hour(s))  URINE CULTURE     Status: None   Collection Time    07/07/12  6:55 PM      Result Value Range Status   Specimen Description URINE, RANDOM   Final   Special Requests ADDED [id number]   Final   Culture  Setup Time 07/07/2012 23:26   Final   Colony Count NO GROWTH   Final   Culture NO GROWTH   Final   Report Status 07/09/2012 FINAL   Final  MRSA PCR SCREENING     Status: None   Collection Time    07/07/12 11:27 PM      Result Value Range Status   MRSA by PCR NEGATIVE  NEGATIVE Final   Comment:            The GeneXpert MRSA Assay (FDA     approved for NASAL specimens     only), is one component of a     comprehensive MRSA colonization     surveillance program. It is not     intended to diagnose MRSA     infection nor to guide or     monitor treatment for     MRSA infections.     Studies:  Recent x-ray studies have been reviewed in detail by the Attending Physician  Scheduled Meds:  Scheduled Meds: . amitriptyline  25 mg Oral QHS  . diltiazem  240 mg Oral Daily  . docusate sodium  100 mg Oral BID  . donepezil  10 mg Oral QHS  . heparin  5,000 Units Subcutaneous Q8H  . levothyroxine  137 mcg Oral QAC breakfast  .  pantoprazole  40 mg Oral Daily  . QUEtiapine  25 mg Oral QHS  . sertraline  25 mg Oral Daily  . sodium chloride  3 mL Intravenous Q12H  . sodium chloride  3 mL Intravenous Q12H   Continuous Infusions:    Time spent on care of this patient: 35 min   Rishan Oyama, MD  [facility] Office  [phone] Pager -  Text Page per Loretha Stapler as per below:  On-Call/Text Page:      [email or web address]      password [email or web address]  If 7PM-7AM, please contact night-coverage [email or web address] Password [email or web address] 07/11/2012, 2:46 PM   LOS: 4 days

## 2012-07-11 NOTE — Progress Notes (Signed)
Pt has been pleasantly confused allday. Family was with patient from 10am to 3pm. Pt. Was restless at 4pm and wanted to get oob. Rn put pt in recliner with waistbelt on and sat patient at desk with staff for socialization. Pt ate supper and around 6pm became agitated,wanted to go home, wanted his shoes.Rn was unable to calm patient with  Reorienting, taking back to room and decreasing stimulation.Assisted back to bed, administered 1mg  IV Ativan. Pt was pulling off his condom catheter, pulling off his telemetry leads, and trying to get oob. Pt was finally calm around 655pm. Called pts son and updated him . During this time, MD had written to transfer pt. Called report to Vernona Rieger, sitter was available at 7pm,so transferred to 3west via bed. Pt was calmer at this point ,and son was in the room when I left.  Tammy Sours

## 2012-07-11 NOTE — Progress Notes (Signed)
Patient ID: Walter Carey, male   DOB: 12-Sheer-1936, 78 y.o.   MRN: [id number]   Consulting cardiologist: Dr. Rollene Rotunda  Subjective:   Still with agitation sedated now.  Wife indicates he is hard to handle at home She has help 3 days/week   Objective:   Temp:  [96.7 F (35.9 C)-98.1 F (36.7 C)] 97.5 F (36.4 C) (07/06 0830) Pulse Rate:  [38-95] 82 (07/06 0832) Resp:  [18-25] 18 (07/06 0338) BP: (143-186)/(61-97) 144/70 mmHg (07/06 0832) SpO2:  [95 %-99 %] 95 % (07/06 0832) Weight:  [177 lb 11.1 oz (80.6 kg)] 177 lb 11.1 oz (80.6 kg) (07/06 0352) Last BM Date: 07/10/12  Filed Weights   07/09/12 0600 07/10/12 0500 07/11/12 0352  Weight: 179 lb 0.2 oz (81.2 kg) 179 lb 3.7 oz (81.3 kg) 177 lb 11.1 oz (80.6 kg)    Intake/Output Summary (Last 24 hours) at 07/11/12 1029 Last data filed at 07/11/12 0400  Gross per 24 hour  Intake    120 ml  Output    400 ml  Net   -280 ml   Telemetry: NSR no SVT 07/11/2012   Exam:  General: No distress.  Lungs: Diminished breath sounds.  Cardiac: RRR, no gallop.  Extremities: No pitting.  Neuro non focal lethargic   Lab Results:  Basic Metabolic Panel:  Recent Labs Lab 07/07/12 1627 07/08/12 0100 07/09/12 0505 07/10/12 0430  NA 138 134* 136 139  K 4.3 4.1 3.4* 3.6  CL 104 100 103 106  CO2  --  26 24 25   GLUCOSE 91 110* 108* 99  BUN 31* 30* 29* 20  CREATININE 2.30* 2.09* 1.80* 1.68*  CALCIUM  --  9.0 8.5 8.7  MG  --  1.9  --   --     CBC:  Recent Labs Lab 07/07/12 1610 07/07/12 1627 07/08/12 0100 07/10/12 0430  WBC 8.6  --  10.3 7.2  HGB 12.7* 12.9* 12.3* 11.6*  HCT 35.7* 38.0* 34.8* 33.1*  MCV 87.7  --  87.7 87.8  PLT 245  --  251 231    Cardiac Enzymes:  Recent Labs Lab 07/08/12 0100 07/08/12 0735 07/08/12 1209  TROPONINI <0.30 <0.30 <0.30    Medications:   Scheduled Medications: . amitriptyline  25 mg Oral QHS  . diltiazem  240 mg Oral Daily  . docusate sodium  100 mg Oral BID  . donepezil  10  mg Oral QHS  . heparin  5,000 Units Subcutaneous Q8H  . levothyroxine  137 mcg Oral QAC breakfast  . pantoprazole  40 mg Oral Daily  . QUEtiapine  25 mg Oral QHS  . sertraline  25 mg Oral Daily  . sodium chloride  3 mL Intravenous Q12H  . sodium chloride  3 mL Intravenous Q12H    Infusions:    PRN Medications: acetaminophen, acetaminophen, haloperidol lactate, LORazepam, ondansetron (ZOFRAN) IV, ondansetron, traMADol   Assessment:   1. PSVT - AVNRT. Limited continue PO cardizem   2. CKD, stage III.  3. Dementia, frequent falls.   Plan/Discussion:    Will try and increase cardizem to 60 mg PO Q6 hours. Echocardiogram is essentially normal with EF 50-55% and mild MR.  ECG review today SR rate 86 PAC.  Postural symptoms related to polypharmacy.  Need to simplify CNS active drugs    Will sign off   Charlton Haws

## 2012-07-12 MED ORDER — HALOPERIDOL LACTATE 5 MG/ML IJ SOLN
1.0000 mg | Freq: Once | INTRAMUSCULAR | Status: DC
  Filled 2012-07-12: qty 0.2

## 2012-07-12 MED ORDER — LORAZEPAM 2 MG/ML IJ SOLN: 0.5000 mg | Freq: Once | INTRAMUSCULAR | Status: DC

## 2012-07-12 NOTE — Progress Notes (Signed)
At approx 2350 notified by CCMD that pts HR up to 150-170s (SVT).  Upon entering room pt found to be very agitated/confused--pulling at mitts and trying to get OOB to go home and becoming somewhat combative (swinging at staff etc).  Unable to reorient pt--and unable to redirect. Pt denied SOB/CP or other complaints.  IV Haldol given per PRN order.  Pts HR maintained greater than 150 for approx 15 minutes and returned to SR/ST 9o's to low 100's.  See saved strips at monitor station.  Will continue to monitor. Dierdre Highman, RN

## 2012-07-12 NOTE — Progress Notes (Signed)
Clinical Child psychotherapist (CSW) provided bed offers to pt daughter and pt and are aware that pt will be ready for discharge tomorrow. CSW to follow up with family tomorrow and assist with dc.  Theresia Bough, MSW, Theresia Majors [phone]

## 2012-07-12 NOTE — Progress Notes (Signed)
Clinical Social Work Department BRIEF PSYCHOSOCIAL ASSESSMENT 07/12/2012  Patient:  Walter Carey, Walter Carey     Account Number:  [id number]     Admit date:  07/07/2012  Clinical Social Worker:  Lourdes Sledge  Date/Time:  07/12/2012 11:07 AM  Referred by:  Physician  Date Referred:  07/12/2012 Referred for  SNF Placement   Other Referral:   Interview type:  Family Other interview type:   CSW completed assessment with pt daughter Shary Decamp [phone] who presents as main contact for pt.    PSYCHOSOCIAL DATA Living Status:  WIFE Admitted from facility:   Level of care:   Primary support name:  Peggy Peckinpaugh/ Shary Decamp Primary support relationship to patient:  FAMILY Degree of support available:   Pt wife and children are actively involved in pt care.    CURRENT CONCERNS Current Concerns  Post-Acute Placement   Other Concerns:    SOCIAL WORK ASSESSMENT / PLAN CSW informed that PT is recommending SNF placement for pt.    CSW unable to complete assessment with pt who presents confused. CSW completed assessment with pt daughter Shary Decamp who presents as main contact for pt. CSW informed Rosey Bath of PT recommendations for SNF placement. initially Rosey Bath was hesitant about SNF as she is unsure whether pt will progress in rehab. CSW explored whether pt has any support at home. Rosey Bath informed CSW that pt spouse lives with pt and pt also receives private duty services 3x a week for 6 hrs each time. Rosey Bath says family has been in discussion about needing to hire additional hours for pt as pt has had multiple falls and due to dementia is becoming difficult to manage at home. CSW explored whether Rosey Bath would like to discuss SNF with family however Rosey Bath agreed that [facility] in the best interest for pt. CSW has received consent to do a SNF search. CSW also informed daughter that pt will need to be sitter free for 24hr hours before he can discharge to a SNF. Daughter agreeable to request  pt family to stay with pt today to provide support.    CSW to fax pt out and submit for a pasarr. Family requesting placement at [facility].   Assessment/plan status:  Psychosocial Support/Ongoing Assessment of Needs Other assessment/ plan:   Information/referral to community resources:   CSW to provide pt family with a SNF list and CSW contact information.    PATIENT'S/FAMILY'S RESPONSE TO PLAN OF CARE: Pt presents with dementia and is confused. Assessment completed with pt daughter who presents as main contact for pt. Daughter and family agreaable to SNF placement for pt.    CSW to remain following.       Theresia Bough, MSW, Theresia Majors [phone]

## 2012-07-12 NOTE — Progress Notes (Signed)
UR Completed Zarea Diesing Graves-Bigelow, RN,BSN [phone]  

## 2012-07-12 NOTE — Progress Notes (Addendum)
Clinical Social Work Department CLINICAL SOCIAL WORK PLACEMENT NOTE 07/12/2012  Patient:  SAYLOR, SHECKLER  Account Number:  [id number] Admit date:  07/07/2012  Clinical Social Worker:  Theresia Bough, Theresia Majors  Date/time:  07/12/2012 11:18 AM  Clinical Social Work is seeking post-discharge placement for this patient at the following level of care:   SKILLED NURSING   (*CSW will update this form in Epic as items are completed)   07/12/2012  Patient/family provided with [facility] Department of Clinical Social Work's list of facilities offering this level of care within the geographic area requested by the patient (or if unable, by the patient's family).  07/12/2012  Patient/family informed of their freedom to choose among providers that offer the needed level of care, that participate in Medicare, Medicaid or managed care program needed by the patient, have an available bed and are willing to accept the patient.  07/12/2012  Patient/family informed of [facility]' ownership interest in [facility], as well as of the fact that they are under no obligation to receive care at this facility.  PASARR submitted to [employer] on 07/12/2012 PASARR number received from [employer] on 07/12/12 [id number]   FL2 transmitted to all facilities in geographic area requested by pt/family on  07/12/2012 FL2 transmitted to all facilities within larger geographic area on   Patient informed that his/her managed care company has contracts with or will negotiate with  certain facilities, including the following:     Patient/family informed of bed offers received:  07/12/12 Patient chooses bed at  Physician recommends and patient chooses bed at    Patient to be transferred to  on   Patient to be transferred to facility by   The following physician request were entered in Epic:   Additional Comments: Family requesting placement at [facility].  Theresia Bough, MSW, Theresia Majors [phone]

## 2012-07-12 NOTE — Progress Notes (Signed)
[facility] Progress Note [place] TEAM 1 - Stepdown/ICU Walter Carey [place] NWG:[id number] DOB: 17-Nov-1934 DOA: 07/07/2012 PCP: Hollice Espy, MD  Brief narrative: 78 y.o. male with PMH significant for hydrocephalus and HTN, who presented to the ED complaining of back pain after a fall. Patient had fallen more than 20 times per family. Patient would get dizzy, and then fall. Last fall was a day prior to admission, when he hit his head and back. He was complaining of back pain, worse on the left side in the thoracic area. He was noticed to have HR 150, SVT. He denied chest pain or dyspnea. He received 6 mg of adenosine without significant responce. He was started on IV Cardizem infusion and converted back to NSR.   Assessment/Plan:  PSVT - AV nodal re-entry tachycardia - Cardizem has been titrated under the direction of Cardiology - no further recommendations by Cars - outpt f/u - HR well controlled at this time   Hydrocephalus with operating shunt - recent falls but no worsening in hydrocephalus noted on CT head  Orthostatic hypotension -Patient noted to be dizzy when standing up to work with physical therapy -blood pressure noted to drop significantly with standing in addition to the fact that patient is symptomatic -hydrated - no longer orthostatic  -polypharmacy [place] be contributing to this issue -likely the etiology of repeat falls  Dementia with behavioral disturbances in the evening  -likely due to sundowning -when necessary Haldol or Ativan -low-dose scheduled Seroquel at bedtime   Hypertension -well controlled at present   Falls -PT/OT to follow - plan is for transfer to SNF for rehab stay when bed available   CKD (chronic kidney disease), stage III baseline Cr ~1.7 - crt now improved back to baseline after hydration  T11 vertebral fracture - 30% loss of height -possibly from recent fall but Shifrin also be chronic -per pt, pain is mild - if he proves to do [facility] [place]  with getting out of bed will not further investigate this fracture - if pain becomes severe, next step would be to perform a MRI to determine age of fracture to consider vertebroplasty  Code Status: FULL Family Communication: Discussed with wife at bedside Disposition Plan: med/surg - awaiting SNF placement   Consultants: Cardiology  Procedures: none  Antibiotics: none  DVT prophylaxis: SQ heparin  HPI/Subjective: Patient is resting comfortably in bed.  He denies any complaints today.  He specifically reports that his back feels fine.  He denies chest pain lightheadedness or dizziness.  Objective: Blood pressure 132/85, pulse 85, temperature 98.4 F (36.9 C), temperature source Oral, resp. rate 20, height 5\' 8"  (1.727 m), weight 78.3 kg (172 lb 9.9 oz), SpO2 95.00%.  Intake/Output Summary (Last 24 hours) at 07/12/12 1521 Last data filed at 07/12/12 1100  Gross per 24 hour  Intake    720 ml  Output      0 ml  Net    720 ml   Exam: General: Alert,  No acute respiratory distress Lungs: Clear to auscultation bilaterally without wheezes or crackles Cardiovascular: Regular rate and rhythm without murmur gallop or rub  Abdomen: Nontender, nondistended, soft, bowel sounds positive, no rebound, no ascites, no appreciable mass Extremities: No significant cyanosis, clubbing, or edema bilateral lower extremities  Data Reviewed: Basic Metabolic Panel:  Recent Labs Lab 07/07/12 1627 07/08/12 0100 07/09/12 0505 07/10/12 0430  NA 138 134* 136 139  K 4.3 4.1 3.4* 3.6  CL 104 100 103 106  CO2  --  26 24 25   GLUCOSE 91 110* 108* 99  BUN 31* 30* 29* 20  CREATININE 2.30* 2.09* 1.80* 1.68*  CALCIUM  --  9.0 8.5 8.7  MG  --  1.9  --   --    CBC:  Recent Labs Lab 07/07/12 1610 07/07/12 1627 07/08/12 0100 07/10/12 0430  WBC 8.6  --  10.3 7.2  NEUTROABS 6.0  --   --   --   HGB 12.7* 12.9* 12.3* 11.6*  HCT 35.7* 38.0* 34.8* 33.1*  MCV 87.7  --  87.7 87.8  PLT 245  --  251  231   Cardiac Enzymes:  Recent Labs Lab 07/08/12 0100 07/08/12 0735 07/08/12 1209  TROPONINI <0.30 <0.30 <0.30    Recent Results (from the past 240 hour(s))  URINE CULTURE     Status: None   Collection Time    07/07/12  6:55 PM      Result Value Range Status   Specimen Description URINE, RANDOM   Final   Special Requests ADDED [id number]   Final   Culture  Setup Time 07/07/2012 23:26   Final   Colony Count NO GROWTH   Final   Culture NO GROWTH   Final   Report Status 07/09/2012 FINAL   Final  MRSA PCR SCREENING     Status: None   Collection Time    07/07/12 11:27 PM      Result Value Range Status   MRSA by PCR NEGATIVE  NEGATIVE Final   Comment:            The GeneXpert MRSA Assay (FDA     approved for NASAL specimens     only), is one component of a     comprehensive MRSA colonization     surveillance program. It is not     intended to diagnose MRSA     infection nor to guide or     monitor treatment for     MRSA infections.     Studies:  Recent x-ray studies have been reviewed in detail by the Attending Physician  Scheduled Meds:  Scheduled Meds: . amitriptyline  25 mg Oral QHS  . diltiazem  240 mg Oral Daily  . docusate sodium  100 mg Oral BID  . donepezil  10 mg Oral QHS  . heparin  5,000 Units Subcutaneous Q8H  . levothyroxine  137 mcg Oral QAC breakfast  . pantoprazole  40 mg Oral Daily  . QUEtiapine  25 mg Oral QHS  . sertraline  25 mg Oral Daily  . sodium chloride  3 mL Intravenous Q12H  . sodium chloride  3 mL Intravenous Q12H    Time spent on care of this patient: 25 mins   Naftula Donahue T, MD  [facility] Office  [phone] Pager - Text Page per Loretha Stapler as per below:  On-Call/Text Page:      [email or web address]      password [email or web address]  If 7PM-7AM, please contact night-coverage [email or web address] Password [email or web address] 07/12/2012, 3:21 PM   LOS: 5 days

## 2012-07-12 NOTE — Progress Notes (Signed)
Patients posey restraint DC'd at 0850.

## 2012-07-13 DIAGNOSIS — IMO0002 Reserved for concepts with insufficient information to code with codable children: Secondary | ICD-10-CM

## 2012-07-13 LAB — CBC
Hemoglobin: 13.6 g/dL (ref 13.0–17.0)
Platelets: 330 10*3/uL (ref 150–400)
RBC: 4.49 MIL/uL (ref 4.22–5.81)
WBC: 5.9 10*3/uL (ref 4.0–10.5)

## 2012-07-13 MED ORDER — RISPERIDONE 0.25 MG PO TABS: 0.2500 mg | ORAL_TABLET | Freq: Two times a day (BID) | ORAL | Status: AC | PRN

## 2012-07-13 MED ORDER — DILTIAZEM HCL ER COATED BEADS 240 MG PO CP24: 240.0000 mg | ORAL_CAPSULE | Freq: Every day | ORAL | Status: DC

## 2012-07-13 MED ORDER — QUETIAPINE FUMARATE 25 MG PO TABS: 25.0000 mg | ORAL_TABLET | Freq: Every day | ORAL | Status: DC

## 2012-07-13 MED ORDER — TRAMADOL HCL 50 MG PO TABS: 50.0000 mg | ORAL_TABLET | Freq: Three times a day (TID) | ORAL | Status: DC | PRN

## 2012-07-13 NOTE — Progress Notes (Signed)
Clinical Child psychotherapist (CSW) has been in touch with pt daughter today to inform her that a facility needed to be chosen (pt has 2 bed offers) as pt is medically ready for discharge. Daughter just informed CSW that she was not pleased with bed offers and is currently visiting facilities in [place]. CSW informed daughter that family did not request placement in [place] and therefore pt information was not sent to those facilities. CSW reminded daughter that pt has been discharged and a facility will need to be chosen today. Daughter stated she was still on her way to visit 2 more facilities in [place] however would take plan to take pt home. CSW informed RNCM.  Theresia Bough, MSW, Theresia Majors [phone]

## 2012-07-13 NOTE — Progress Notes (Signed)
Pt DC'd to home with family and home health PT, OT, RN, aide, and Child psychotherapist. Stressed importance of safety. Discussed new medications, when to call MD, and when to call 911. Family is aware that they need to make a follow up appointment to recheck kidney function. New meds called into [facility] in [place] per Dr. Butler Denmark. Family refused SNF placement at this time. Received carenotes on new medications. Duwaine Maxin, RN

## 2012-07-13 NOTE — Discharge Summary (Addendum)
Physician Discharge Summary  Walter Carey ZOX:[id number] DOB: [birth year] DOA: 07/07/2012  PCP: Hollice Espy, MD  Admit date: 07/07/2012 Discharge date: 07/13/2012  Time spent: 45 minutes  Recommendations for Outpatient Follow-up:  1. F/u with Dr Shaune Pollack in 1 wk 2. Pt advised to go to nursing facility for rehab but family has decided to take him home instread  Discharge Diagnoses:  Principal Problem:   AVNRT (AV nodal re-entry tachycardia) Active Problems:   Hydrocephalus with operating shunt   Dementia   Hypertension   Falls   CKD (chronic kidney disease), stage III   T11 vertebral fracture   Discharge Condition: stable  Diet recommendation: heart healthy  Filed Weights   07/11/12 1924 07/12/12 0620 07/13/12 0700  Weight: 78.3 kg (172 lb 9.9 oz) 78.3 kg (172 lb 9.9 oz) 80.06 kg (176 lb 8 oz)    History of present illness:  Walter Carey is a 78 y.o. male with PMH significant for Hydrocephalus, HTN, who presents to ED complaining of back pain after a fall. Patient has fall more than 20 times per family. Patient get dizzy, and fall. Last fall was day prior to admission, he hit his head and back. He is complaining of back pain, worse left side thoracic area. He was notice to have HR 150, SVT. He denies chest pain or dyspnea. He is asymptomatic from tachycardia. He received 6 mg of adenosine without significant respond. He was started on IV Cardizem infusion and has converted back to NSR.    Hospital Course:  AVNRT (AV nodal re-entry tachycardia)  - Cardizem has been increased by the cardiology group- tachycardia improving-I have changed from short acting to long acting Cardizem  - no further recommendations by Cars- outpt f/u   Active Problems:  Hydrocephalus with operating shunt  - recent falls but no worsening in hydrocephalus noted on CT head   Orthostatic hypotension  -Patient noted to be dizzy when standing up to work with physical therapy today  -Orthostatic vitals  performed in blood pressure noted to drop significantly with standing in addition to the fact that patient is symptomatic  -Hydrated- no longer orthostatic  -This [place] be the reason that the patient has been falling at home   Dementia with behavioral disturbances in the evening likely due to sundowning  -When necessary Risperdal ordered for use at nursing facility -low-dose Seroquel at bedtime   Hypertension  - stable -takes Norvasc at home but now on PO Cardizem   Falls  - PT eval ordered-   CKD (chronic kidney disease), stage III  Slightly worse than baseline Cr (1.7) on admission- now improved after hydration  -   T11 vertebral fracture- 30% loss of height  - possibly from recent fall but Peral also be chronic  - per pt, pain is mild- if he proves to do [facility] with getting out of bed, will not further investigate this fracture- would need vertebroplasty if fracture is acute and patient is symptomatic- if pain is severe, next step would be to perform an MRI to determine age of fracture   Consultations:  Cardiology  Discharge Exam: Filed Vitals:   07/12/12 1317 07/12/12 2103 07/13/12 0700 07/13/12 1020  BP: 132/85 152/96 134/81 178/81  Pulse: 85 91 88 86  Temp: 98.4 F (36.9 C) 97.4 F (36.3 C) 97.6 F (36.4 C)   TempSrc: Oral Oral Oral   Resp: 20 18 18    Height:      Weight:   80.06 kg (176  lb 8 oz)   SpO2: 95% 95% 94%     General: AA but disoriented. No distress Cardiovascular: RRR, no murmurs Respiratory: CTA b/l   Discharge Instructions      Discharge Orders   Future Orders Complete By Expires     Diet - low sodium heart healthy  As directed     Increase activity slowly  As directed         Medication List    STOP taking these medications       amLODipine 5 MG tablet  Commonly known as:  NORVASC      TAKE these medications       amitriptyline 25 MG tablet  Commonly known as:  ELAVIL  Take 25 mg by mouth at bedtime.     B-complex with vitamin C  tablet  Take 1 tablet by mouth daily.     CALCIUM-VITAMIN D PO  Take 1 tablet by mouth 2 (two) times daily.     diltiazem 240 MG 24 hr capsule  Commonly known as:  CARDIZEM CD  Take 1 capsule (240 mg total) by mouth daily.     donepezil 10 MG tablet  Commonly known as:  ARICEPT  Take 10 mg by mouth at bedtime.     fish oil-omega-3 fatty acids 1000 MG capsule  Take 1 g by mouth 2 (two) times daily.     levothyroxine 137 MCG tablet  Commonly known as:  SYNTHROID, LEVOTHROID  Take 137 mcg by mouth daily before breakfast.     multivitamin with minerals Tabs  Take 1 tablet by mouth daily.     omeprazole 20 MG capsule  Commonly known as:  PRILOSEC  Take 20 mg by mouth 2 (two) times daily.     QUEtiapine 25 MG tablet  Commonly known as:  SEROQUEL  Take 1 tablet (25 mg total) by mouth at bedtime.     risperiDONE 0.25 MG tablet  Commonly known as:  RISPERDAL  Take 1 tablet (0.25 mg total) by mouth 2 (two) times daily as needed (for agitation).     sertraline 50 MG tablet  Commonly known as:  ZOLOFT  Take 25 mg by mouth daily.     traMADol 50 MG tablet  Commonly known as:  ULTRAM  Take 1 tablet (50 mg total) by mouth every 8 (eight) hours as needed.     Vitamin D-3 1000 UNITS Caps  Take 1,000 Units by mouth 3 (three) times daily.     VITAMIN E PO  Take 1 capsule by mouth daily.       Allergies  Allergen Reactions  . Asa (Aspirin)   . Erythromycin   . Morphine And Related   . Oxycodone   . Stadol (Butorphanol)   . Tylox (Oxycodone-Acetaminophen)   . Versed (Midazolam)       The results of significant diagnostics from this hospitalization (including imaging, microbiology, ancillary and laboratory) are listed below for reference.    Significant Diagnostic Studies: Dg Chest 2 View  07/07/2012   *RADIOLOGY REPORT*  Clinical Data: Fall.  Hypoxia.  CHEST - 2 VIEW  Comparison: Two-view chest 04/03/2008.  Findings: Low lung volumes exaggerate the heart size.  Mild  pulmonary vascular congestion is present.  No focal airspace consolidation is evident.  No significant effusions are present. Mild bibasilar atelectasis is noted.  A VP shunt catheter is in place.  IMPRESSION:  1.  Low lung volumes and mild pulmonary vascular congestion. 2.  No significant airspace consolidation.  Original Report Authenticated By: Marin Roberts, M.D.   Dg Thoracic Spine W/swimmers  07/08/2012   *RADIOLOGY REPORT*  Clinical Data: Back pain.  Fall.  Mid back pain.  THORACIC SPINE - 2 VIEW + SWIMMERS  Comparison: None.  Findings: There is a T11 compression fracture present with about 30% loss of vertebral body height.  No gross radiographic evidence of retropulsion.  This is new compared to 04/03/2008.  Other vertebral bodies demonstrate preserved height with scattered Schmorl's nodes.  The frontal view is rotated to the right.  This distorts the appearance of the upper thoracic spine.  There is no definite upper thoracic compression fracture.  IMPRESSION: T11 compression fracture with about 30% loss of vertebral body height and no radiographic evidence of retropulsion.  The suspect that this represents acute or subacute compression fracture in the setting of recent fall however strictly speaking, this is age indeterminant.  Consider follow-up MRI to assess for marrow edema which would help determine the age.   Original Report Authenticated By: Andreas Newport, M.D.   Dg Lumbar Spine Complete  07/07/2012   *RADIOLOGY REPORT*  Clinical Data: Fall.  Low back pain.  LUMBAR SPINE - COMPLETE 4+ VIEW  Comparison: Lumbar spine radiographs 01/10/2008.  Findings: Transitional anatomy is again noted at L5/S1.  Vertebral body heights and alignment are maintained.  There is progressive loss of disc height at L3-4 with endplate changes.  Chronic loss of disc height at L4-5 is stable.  Mild facet degenerative changes are noted bilaterally.  A proximal left iliac artery stent is stable in position.   IMPRESSION:  1.  Transitional anatomy is again noted. 2.  Progressive vertebral body loss and endplate changes at L3-4. 3.  Stable chronic loss of disc height at L4-5. 4.  A proximal left iliac artery stent is in place.   Original Report Authenticated By: Marin Roberts, M.D.   Ct Head Wo Contrast  07/07/2012   *RADIOLOGY REPORT*  Clinical Data: Fall  CT HEAD WITHOUT CONTRAST  Technique:  Contiguous axial images were obtained from the base of the skull through the vertex without contrast.  Comparison: Prior CT from 11/12/2010  Findings: Right frontal approach ventricular catheter terminates in the right lateral ventricle.  Overall ventricular size is not significantly changed as compared to the prior study. Scattered and confluent hypodensity within the periventricular white matter is consistent with chronic small vessel ischemic changes. Calcification within the cerebellar vermis is again noted, unchanged.  No acute intracranial hemorrhage or infarct.  No mass lesion or midline shift.  No extra-axial fluid collection.  Calvarium is intact.  Scattered mucosal thickening is present within the ethmoidal air cells.  Mastoid air cells are clear.  IMPRESSION:  1.   No significant interval change in hydrocephalus with cerebral atrophy and chronic small vessel ischemic changes.  Right ventricular catheter is stable in position with tip in the right lateral ventricle.  2.  No acute intracranial process.   Original Report Authenticated By: Rise Mu, M.D.   Dg Chest Port 1 View  07/10/2012   *RADIOLOGY REPORT*  Clinical Data: Hypoxia  PORTABLE CHEST - 1 VIEW  Comparison: 07/07/2012  Findings: Heart size is normal.  There is no pleural effusion identified.  Mild diffuse interstitial edema noted.  The lung volumes appear lobe.  IMPRESSION:  1.  Mild edema.   Original Report Authenticated By: Signa Kell, M.D.    Microbiology: Recent Results (from the past 240 hour(s))  URINE CULTURE     Status: None  Collection Time    07/07/12  6:55 PM      Result Value Range Status   Specimen Description URINE, RANDOM   Final   Special Requests ADDED [id number]   Final   Culture  Setup Time 07/07/2012 23:26   Final   Colony Count NO GROWTH   Final   Culture NO GROWTH   Final   Report Status 07/09/2012 FINAL   Final  MRSA PCR SCREENING     Status: None   Collection Time    07/07/12 11:27 PM      Result Value Range Status   MRSA by PCR NEGATIVE  NEGATIVE Final   Comment:            The GeneXpert MRSA Assay (FDA     approved for NASAL specimens     only), is one component of a     comprehensive MRSA colonization     surveillance program. It is not     intended to diagnose MRSA     infection nor to guide or     monitor treatment for     MRSA infections.     Labs: Basic Metabolic Panel:  Recent Labs Lab 07/07/12 1627 07/08/12 0100 07/09/12 0505 07/10/12 0430  NA 138 134* 136 139  K 4.3 4.1 3.4* 3.6  CL 104 100 103 106  CO2  --  26 24 25   GLUCOSE 91 110* 108* 99  BUN 31* 30* 29* 20  CREATININE 2.30* 2.09* 1.80* 1.68*  CALCIUM  --  9.0 8.5 8.7  MG  --  1.9  --   --    Liver Function Tests: No results found for this basename: AST, ALT, ALKPHOS, BILITOT, PROT, ALBUMIN,  in the last 168 hours No results found for this basename: LIPASE, AMYLASE,  in the last 168 hours No results found for this basename: AMMONIA,  in the last 168 hours CBC:  Recent Labs Lab 07/07/12 1610 07/07/12 1627 07/08/12 0100 07/10/12 0430 07/13/12 0420  WBC 8.6  --  10.3 7.2 5.9  NEUTROABS 6.0  --   --   --   --   HGB 12.7* 12.9* 12.3* 11.6* 13.6  HCT 35.7* 38.0* 34.8* 33.1* 39.3  MCV 87.7  --  87.7 87.8 87.5  PLT 245  --  251 231 330   Cardiac Enzymes:  Recent Labs Lab 07/08/12 0100 07/08/12 0735 07/08/12 1209  TROPONINI <0.30 <0.30 <0.30   BNP: BNP (last 3 results) No results found for this basename: PROBNP,  in the last 8760 hours CBG: No results found for this basename: GLUCAP,   in the last 168 hours     Signed:  Johari Bennetts  [facility] 07/13/2012, 12:39 PM

## 2012-07-13 NOTE — Care Management Note (Signed)
CM called daughter Rosey Bath to see if she had come to any decision for disposition of her father. Daughter stated they were out looking at [facility] in [place].[facility], [facility] and [facility]. CM did speak to daughter to see if she did not find a SNF that family agreed upon, would [facility] be an option. CM did try to contact daughter around 4:15 because a bed is available at [facility]. CM will wait a little longer to see if daughter will call back. Gala Lewandowsky , RN,BSN [phone]

## 2012-07-13 NOTE — Progress Notes (Signed)
Physical Therapy Treatment Patient Details Name: Walter Carey Nevin MRN: [id number] DOB: [birth year] Today's Date: 07/13/2012 Time: 4098-1191 PT Time Calculation (min): 27 min  PT Assessment / Plan / Recommendation  PT Comments   Pt demonstrates some progress towards PT goals today. Patient with less assist required for sit to stand transfers. Patient still with decreased activity tolerance and functional deficits. Spoke with patient and family at length regarding recommendations for discharge, additionally, educated family on techniques for decreasing effects of sun downing, mental status changes. Will continue to work with patient acutely and progress activity as tolerated.    Follow Up Recommendations  SNF           Equipment Recommendations  None recommended by PT       Frequency Min 3X/week   Progress towards PT Goals  progressing towards goals  Plan Current plan remains appropriate    Precautions / Restrictions Precautions Precautions: Fall;Back   Pertinent Vitals/Pain NAD    Mobility  Transfers Transfers: Sit to Stand;Stand to Sit Sit to Stand: 4: Min assist;With armrests;From chair/3-in-1 Stand to Sit: 4: Min assist;With armrests;To chair/3-in-1 Details for Transfer Assistance: Multi modal cues for hand placement, Patient able to reach standing, once hands positioned on rw patient begins to lean posteriorly during static standing Ambulation/Gait Ambulation/Gait Assistance: Not tested (comment)    Exercises Total Joint Exercises Marching in Standing: AROM;Both;10 reps (Max cues for foot elevation and clearance) General Exercises - Lower Extremity Ankle Circles/Pumps: AROM;Both;10 reps     PT Goals (current goals can now be found in the care plan section) Acute Rehab PT Goals Patient Stated Goal: did not set PT Goal Formulation: Patient unable to participate in goal setting Time For Goal Achievement: 07/23/12 Potential to Achieve Goals: Fair  Visit Information  Last  PT Received On: 07/13/12 History of Present Illness: Multiple falls, Hydrocephalus, T11 veterbal fx    Subjective Data  Subjective: I know when i cant do it Patient Stated Goal: did not set   Cognition  Cognition Arousal/Alertness: Awake/alert Behavior During Therapy: Flat affect Overall Cognitive Status: No family/caregiver present to determine baseline cognitive functioning Memory: Decreased short-term memory    Balance  Balance Balance Assessed: Yes Static Sitting Balance Static Sitting - Balance Support: Feet supported Static Sitting - Level of Assistance: 5: Stand by assistance Static Sitting - Comment/# of Minutes: Edge of chair Cytogeneticist Standing - Balance Support: Bilateral upper extremity supported Static Standing - Level of Assistance: 3: Mod assist Static Standing - Comment/# of Minutes: 2 minites x 3; patient with significant posterior lean requiring increased assist to maintain standing.  End of Session PT - End of Session Equipment Utilized During Treatment: Gait belt;Oxygen Activity Tolerance: Patient limited by fatigue;Other (comment) Patient left: in chair;with call bell/phone within reach Nurse Communication: Mobility status   GP     Fabio Asa 07/13/2012, 1:06 PM Charlotte Crumb, PT DPT  [phone]

## 2012-12-24 ENCOUNTER — Ambulatory Visit (INDEPENDENT_AMBULATORY_CARE_PROVIDER_SITE_OTHER): Payer: Medicare Other | Admitting: Neurology

## 2012-12-24 ENCOUNTER — Encounter: Payer: Self-pay | Admitting: Neurology

## 2012-12-24 VITALS — BP 135/78 | HR 76

## 2012-12-24 DIAGNOSIS — R413 Other amnesia: Secondary | ICD-10-CM

## 2012-12-24 DIAGNOSIS — F0390 Unspecified dementia without behavioral disturbance: Secondary | ICD-10-CM

## 2012-12-24 DIAGNOSIS — R269 Unspecified abnormalities of gait and mobility: Secondary | ICD-10-CM | POA: Insufficient documentation

## 2012-12-24 DIAGNOSIS — G912 (Idiopathic) normal pressure hydrocephalus: Secondary | ICD-10-CM

## 2012-12-24 HISTORY — DX: Unspecified abnormalities of gait and mobility: R26.9

## 2012-12-24 HISTORY — DX: (Idiopathic) normal pressure hydrocephalus: G91.2

## 2012-12-24 NOTE — Patient Instructions (Signed)

## 2012-12-24 NOTE — Progress Notes (Signed)
Reason for visit: Normal pressure hydrocephalus  Walter Carey is an 78 y.o. male  History of present illness:  Mr. Bolger is a 78 year old right-handed white male with a history of a progressive dementing illness and a gait disorder thought secondary to normal pressure hydrocephalus. The patient had a VP shunt placed in 2010, and he subsequently developed a bifrontal subdural hematomas. The hematomas resolved spontaneously after readjustment of the VP shunt pressure settings. The patient initially he had some improvement in the walking, but over the last 18 months, the walking has gradually worsened. The patient has been falling regularly, several times a week. The patient has poor judgment, and he will try to ambulate independently, falling even while using a cane. The patient has had physical therapy on several occasions, but the patient is unable to remember procedures well enough to get any benefit from the physical therapy. The patient does not know how to use a walker properly, and he allows a walker to get out in front of him, resulting in a fall. The patient has a neurogenic bladder, with urinary frequency. The patient has urinary incontinence at night, and he wears an adult diaper. The patient has had some gradual progression of memory problems. The patient comes to the office today for further evaluation. The last CT scan of the brain was done in July 2014, and the study was reviewed on line. Images were compared to a prior study done in November of 2010, and I perceive no difference in the degree of ventriculomegaly from 2010 to July 2014. The patient also has a peripheral neuropathy with impaired sensation in the feet. The patient reports dizziness while standing, this goes away when sitting.  Past Medical History  Diagnosis Date  . Hypertension   . Dementia   . Hydrocephalus with operating shunt   . Neuropathy   . AVNRT (AV nodal re-entry tachycardia)   . Falls   . CKD (chronic kidney  disease), stage III   . Normal pressure hydrocephalus 12/24/2012  . Abnormality of gait 12/24/2012  . Dyslipidemia   . Hypothyroidism   . Personal history of subdural hematoma     Bilateral, following VP shunt placement  . Migraine headache   . Cancer     Prostate cancer  . Memory loss     Past Surgical History  Procedure Laterality Date  . Carotid artery angioplasty    . Cystostomy w/ bladder biopsy    . Ventriculoperitoneal shunt      PROGRAMMABLE  . Cataract extraction Bilateral   . Shoulder surgery Left     Bone spur  . Carotid endarterectomy      Right    Family History  Problem Relation Age of Onset  . Stroke Mother   . Diabetes Father     Social history:  reports that he has been smoking.  He has never used smokeless tobacco. He reports that he does not drink alcohol or use illicit drugs.    Allergies  Allergen Reactions  . Asa [Aspirin]   . Erythromycin   . Morphine And Related   . Oxycodone   . Stadol [Butorphanol]   . Tylox [Oxycodone-Acetaminophen]   . Versed [Midazolam]     Medications:  Current Outpatient Prescriptions on File Prior to Visit  Medication Sig Dispense Refill  . amitriptyline (ELAVIL) 25 MG tablet Take 25 mg by mouth at bedtime.      . B Complex-C (B-COMPLEX WITH VITAMIN C) tablet Take 1  tablet by mouth daily.      Marland Kitchen CALCIUM-VITAMIN D PO Take 1 tablet by mouth 2 (two) times daily.      . Cholecalciferol (VITAMIN D-3) 1000 UNITS CAPS Take 1,000 Units by mouth 3 (three) times daily.      Marland Kitchen diltiazem (CARDIZEM CD) 240 MG 24 hr capsule Take 1 capsule (240 mg total) by mouth daily.      Marland Kitchen donepezil (ARICEPT) 10 MG tablet Take 10 mg by mouth at bedtime.      . fish oil-omega-3 fatty acids 1000 MG capsule Take 1 g by mouth 2 (two) times daily.      Marland Kitchen levothyroxine (SYNTHROID, LEVOTHROID) 137 MCG tablet Take 137 mcg by mouth daily before breakfast.      . Multiple Vitamin (MULTIVITAMIN WITH MINERALS) TABS Take 1 tablet by mouth daily.        Marland Kitchen omeprazole (PRILOSEC) 20 MG capsule Take 20 mg by mouth 2 (two) times daily.      . QUEtiapine (SEROQUEL) 25 MG tablet Take 1 tablet (25 mg total) by mouth at bedtime.      . risperiDONE (RISPERDAL) 0.25 MG tablet Take 1 tablet (0.25 mg total) by mouth 2 (two) times daily as needed (for agitation).  30 tablet  0  . sertraline (ZOLOFT) 50 MG tablet Take 25 mg by mouth daily.      Marland Kitchen VITAMIN E PO Take 1 capsule by mouth daily.      . traMADol (ULTRAM) 50 MG tablet Take 1 tablet (50 mg total) by mouth every 8 (eight) hours as needed.  30 tablet  0   No current facility-administered medications on file prior to visit.    ROS:  Out of a complete 14 system review of symptoms, the patient complains only of the following symptoms, and all other reviewed systems are negative.  Activity change, fatigue Runny nose Burning eyes Wheezing, shortness of breath Excessive thirst Incontinence of bowel and bladder Daytime sleepiness Difficulty walking, concentration problems, memory loss, dizziness Bruising easily Weakness Agitation, confusion, decreased concentration, depression, anxiety  Blood pressure 135/78, pulse 76, weight 0 lb (0 kg).  Blood pressure sitting, right arm, is 132/84. Blood pressure standing, right arm, is 144 systolic.  Physical Exam  General: The patient is alert and cooperative at the time of the examination.  Skin: No significant peripheral edema is noted.   Neurologic Exam  Mental status: The Mini-Mental status examination done today shows a total score of 18/30.  Cranial nerves: Facial symmetry is present. Speech is normal, no aphasia or dysarthria is noted. Extraocular movements are full. Visual fields are full.  Motor: The patient has good strength in all 4 extremities.  Sensory examination: There is a stocking pattern pinprick sensory deficit in the legs bilaterally, two thirds the way up the legs to the knee. Position sensation is significantly impaired in  the left foot, moderately impaired in the right foot.  Coordination: The patient has good finger-nose-finger and heel-to-shin bilaterally. Slight apraxia with the use of the extremities is noted.  Gait and station: The patient requires assistance with standing. The patient has a slight tendency to lean backwards. The patient is able to walk a short distance with assistance, stance is wide-based. The patient complains of dizziness shortly after standing, feels weak and has to sit down after only a few steps. Tandem gait was not attempted.  Reflexes: Deep tendon reflexes are symmetric, but are depressed.   Assessment/Plan:  1. Normal pressure hydrocephalus  2. Urinary  incontinence  3. Gait disturbance, multiple falls  4. Dementia  The patient has a significant risk of falls. The patient will require a belt around the chair while sitting. The patient does not appear to have any change in his ventriculomegaly over the last 4 or 5 years. The treatment for the gait disorder is a wheelchair. The patient should not be ambulated without someone helping him at all times. The patient is to remain on Aricept, and he will followup in 6-8 months.  Marlan Palau MD 12/25/2012 8:53 AM  [facility] [address] [place], [place] [address]  Phone [phone] Fax [phone]

## 2012-12-25 DIAGNOSIS — R413 Other amnesia: Secondary | ICD-10-CM | POA: Insufficient documentation

## 2013-06-24 ENCOUNTER — Encounter (INDEPENDENT_AMBULATORY_CARE_PROVIDER_SITE_OTHER): Payer: Self-pay

## 2013-06-24 ENCOUNTER — Encounter: Payer: Self-pay | Admitting: Neurology

## 2013-06-24 ENCOUNTER — Ambulatory Visit (INDEPENDENT_AMBULATORY_CARE_PROVIDER_SITE_OTHER): Payer: Medicare Other | Admitting: Neurology

## 2013-06-24 VITALS — BP 127/81 | HR 83

## 2013-06-24 DIAGNOSIS — R269 Unspecified abnormalities of gait and mobility: Secondary | ICD-10-CM

## 2013-06-24 DIAGNOSIS — R413 Other amnesia: Secondary | ICD-10-CM

## 2013-06-24 DIAGNOSIS — G912 (Idiopathic) normal pressure hydrocephalus: Secondary | ICD-10-CM

## 2013-06-24 NOTE — Progress Notes (Signed)
Reason for visit: Normal pressure hydrocephalus  Walter Carey is an 79 y.o. male  History of present illness:  Walter Carey is a 80 year old right-handed white male with a history of normal pressure hydrocephalus status post VP shunt placement. The patient did well for about 18 months, but then he has decompensated with worsening walking, memory, and bladder control. The patient has had no change in ventricular size by CT scan. The last scan was done in July of 2014. The patient returns to this office for an evaluation. He will try to walk by himself, and he will fall. With a walker, he will tend to keep the walker too far ahead of him. He has gotten some physical therapy recently that has benefited his gait stability. He returns for an evaluation. The patient will sundown at night frequently.  Past Medical History  Diagnosis Date  . Hypertension   . Dementia   . Hydrocephalus with operating shunt   . Neuropathy   . AVNRT (AV nodal re-entry tachycardia)   . Falls   . CKD (chronic kidney disease), stage III   . Normal pressure hydrocephalus 12/24/2012  . Abnormality of gait 12/24/2012  . Dyslipidemia   . Hypothyroidism   . Personal history of subdural hematoma     Bilateral, following VP shunt placement  . Migraine headache   . Cancer     Prostate cancer  . Memory loss     Past Surgical History  Procedure Laterality Date  . Carotid artery angioplasty    . Cystostomy w/ bladder biopsy    . Ventriculoperitoneal shunt      PROGRAMMABLE  . Cataract extraction Bilateral   . Shoulder surgery Left     Bone spur  . Carotid endarterectomy      Right    Family History  Problem Relation Age of Onset  . Stroke Mother   . Diabetes Father     Social history:  reports that he has been smoking.  He has never used smokeless tobacco. He reports that he does not drink alcohol or use illicit drugs.    Allergies  Allergen Reactions  . Asa [Aspirin]   . Erythromycin   . Morphine And  Related   . Oxycodone   . Stadol [Butorphanol]   . Tylox [Oxycodone-Acetaminophen]   . Versed [Midazolam]     Medications:  Current Outpatient Prescriptions on File Prior to Visit  Medication Sig Dispense Refill  . amitriptyline (ELAVIL) 25 MG tablet Take 25 mg by mouth at bedtime.      . B Complex-C (B-COMPLEX WITH VITAMIN C) tablet Take 1 tablet by mouth daily.      Marland Kitchen CALCIUM-VITAMIN D PO Take 1 tablet by mouth 2 (two) times daily.      . Cholecalciferol (VITAMIN D-3) 1000 UNITS CAPS Take 1,000 Units by mouth 3 (three) times daily.      Marland Kitchen diltiazem (CARDIZEM CD) 240 MG 24 hr capsule Take 1 capsule (240 mg total) by mouth daily.      Marland Kitchen donepezil (ARICEPT) 10 MG tablet Take 10 mg by mouth at bedtime.      . fish oil-omega-3 fatty acids 1000 MG capsule Take 1 g by mouth 2 (two) times daily.      . Multiple Vitamin (MULTIVITAMIN WITH MINERALS) TABS Take 1 tablet by mouth daily.      Marland Kitchen omeprazole (PRILOSEC) 20 MG capsule Take 20 mg by mouth 2 (two) times daily.      . QUEtiapine (  SEROQUEL) 25 MG tablet Take 1 tablet (25 mg total) by mouth at bedtime.      . risperiDONE (RISPERDAL) 0.25 MG tablet Take 1 tablet (0.25 mg total) by mouth 2 (two) times daily as needed (for agitation).  30 tablet  0  . sertraline (ZOLOFT) 50 MG tablet Take 25 mg by mouth daily.      . traMADol (ULTRAM) 50 MG tablet Take 1 tablet (50 mg total) by mouth every 8 (eight) hours as needed.  30 tablet  0  . VITAMIN E PO Take 1 capsule by mouth daily.      Marland Kitchen levothyroxine (SYNTHROID, LEVOTHROID) 137 MCG tablet Take 137 mcg by mouth daily before breakfast.       No current facility-administered medications on file prior to visit.    ROS:  Out of a complete 14 system review of symptoms, the patient complains only of the following symptoms, and all other reviewed systems are negative.  Runny nose Eye burning Shortness of breath Excessive thirst Incontinence of bladder Walking difficulty Bruising easily Memory  loss, dizziness Agitation, confusion, decreased concentration, anxiety   Blood pressure 127/81, pulse 83, weight 0 lb (0 kg).  Physical Exam  General: The patient is alert and cooperative at the time of the examination.  Skin: No significant peripheral edema is noted.   Neurologic Exam  Mental status: The Mini-Mental status examination done today shows a total score of 10/30.   Cranial nerves: Facial symmetry is present. Speech is normal, no aphasia or dysarthria is noted. Extraocular movements are full. Visual fields are full.  Motor: The patient has good strength in all 4 extremities.  Sensory examination: soft touch sensation is symmetric on the face, arms, and legs.   Coordination: The patient has good finger-nose-finger and heel-to-shin bilaterally. the patient has significant apraxia with the use of the extremities.   Gait and station: The patient has a wide-based, unsteady gait. The patient can walk only with assistance. Tandem gait was not attempted.  Romberg is positive. No drift is seen.  Reflexes: Deep tendon reflexes are symmetric.   Assessment/Plan:   1. Normal pressure hydrocephalus  2. Severe gait disorder  3. Dementia  4. Urinary incontinence  The patient is doing poorly at this point. He remains a high risk for falls, he will try to walk by himself, and fall over. The patient will be sent for a repeat CT scan of the brain. The patient will be tapered off of amitriptyline that he is taking at night and continue the Aricept. He will followup in about 6 months. Prognosis for this gentleman is poor.  Jill Alexanders MD 06/26/2013 9:12 PM  [facility] [address] [place], [place] [address]  Phone [phone] Fax [phone]

## 2013-06-24 NOTE — Patient Instructions (Signed)

## 2013-07-04 ENCOUNTER — Ambulatory Visit
Admission: RE | Admit: 2013-07-04 | Discharge: 2013-07-04 | Disposition: A | Discharge status: Home / Self Care | Payer: Medicare Other | Source: Ambulatory Visit | Attending: Neurology | Admitting: Neurology

## 2013-07-04 DIAGNOSIS — G912 (Idiopathic) normal pressure hydrocephalus: Secondary | ICD-10-CM

## 2013-07-04 DIAGNOSIS — R269 Unspecified abnormalities of gait and mobility: Secondary | ICD-10-CM

## 2013-07-04 DIAGNOSIS — R413 Other amnesia: Secondary | ICD-10-CM

## 2013-07-05 ENCOUNTER — Telehealth: Payer: Self-pay | Admitting: Neurology

## 2013-07-05 DIAGNOSIS — G912 (Idiopathic) normal pressure hydrocephalus: Secondary | ICD-10-CM

## 2013-07-05 NOTE — Telephone Encounter (Signed)
The CT scan does not show clear change from approximately one year ago, but when compared to a study done from 2008, the ventricular size is slightly larger today. I called the patient and I talked with the wife. The patient had a significant decline overall his functional status over the last 18 months, with a change in his walking, memory, bladder control. He is no longer able to walk independently. Even though the ventricular size has not changed since July 2014, I would still wonder whether a minor adjustment in the shunt could help him. I will refer him to Dr. Ellene Route to see if he would be willing to adjust the shunt setting slightly and look for any clinical improvement.    CT head 07/05/2013:  Impression   Abnormal CT head (without) demonstrating: 1. Moderate to severe ventriculomegaly.  2. Moderate periventricular and subcortical chronic small vessel ischemic  disease vs transependymal edema.  3. No acute findings. No significant change from CT on 07/07/12.

## 2013-07-15 ENCOUNTER — Telehealth: Payer: Self-pay | Admitting: Neurology

## 2013-07-15 NOTE — Telephone Encounter (Signed)
Patient's wife Vickii Chafe calling and wants to know if Dr. Jannifer Franklin is ordering another procedure for patient, patient's wife states she's confused, please return call to patient and advise.

## 2013-07-18 ENCOUNTER — Telehealth: Payer: Self-pay | Admitting: *Deleted

## 2013-07-18 NOTE — Telephone Encounter (Signed)
Spoke with Mrs. Walter Carey, let her know a referral has been sent to Dr. Clarice Pole office for re-eval; possible shunt adjustment. She was advised to give Dr. Clarice Pole office a call if she has not heard from them by mid-week. She verbalized understanding.

## 2013-07-18 NOTE — Telephone Encounter (Signed)
Please advise previous note. Thanks  °

## 2013-07-18 NOTE — Telephone Encounter (Signed)
I called the family, the patient has had a CT scan of the head, they do not need an MRI unless Dr. Ellene Route thinks that it is necessary. The patient does not have to bring the disc with them. They have an appointment to see Dr. Ellene Route.

## 2013-07-31 ENCOUNTER — Emergency Department (HOSPITAL_COMMUNITY): Payer: Medicare Other

## 2013-07-31 ENCOUNTER — Inpatient Hospital Stay (HOSPITAL_COMMUNITY): Payer: Medicare Other

## 2013-07-31 ENCOUNTER — Inpatient Hospital Stay (HOSPITAL_COMMUNITY)
Admission: EM | Admit: 2013-07-31 | Discharge: 2013-08-08 | DRG: 481 | Disposition: A | Discharge status: Skilled Nursing Facility | Payer: Medicare Other | Attending: Internal Medicine | Admitting: Internal Medicine

## 2013-07-31 ENCOUNTER — Encounter (HOSPITAL_COMMUNITY): Payer: Self-pay | Admitting: Emergency Medicine

## 2013-07-31 ENCOUNTER — Encounter (HOSPITAL_COMMUNITY)
Admission: EM | Disposition: A | Discharge status: Skilled Nursing Facility | Payer: Self-pay | Source: Home / Self Care | Attending: Internal Medicine

## 2013-07-31 ENCOUNTER — Encounter (HOSPITAL_COMMUNITY): Payer: Medicare Other | Admitting: Anesthesiology

## 2013-07-31 ENCOUNTER — Inpatient Hospital Stay (HOSPITAL_COMMUNITY): Payer: Medicare Other | Admitting: Anesthesiology

## 2013-07-31 DIAGNOSIS — I4719 Other supraventricular tachycardia: Secondary | ICD-10-CM

## 2013-07-31 DIAGNOSIS — Y9210 Unspecified residential institution as the place of occurrence of the external cause: Secondary | ICD-10-CM | POA: Diagnosis present

## 2013-07-31 DIAGNOSIS — R269 Unspecified abnormalities of gait and mobility: Secondary | ICD-10-CM

## 2013-07-31 DIAGNOSIS — F0390 Unspecified dementia without behavioral disturbance: Secondary | ICD-10-CM

## 2013-07-31 DIAGNOSIS — R32 Unspecified urinary incontinence: Secondary | ICD-10-CM | POA: Diagnosis present

## 2013-07-31 DIAGNOSIS — G309 Alzheimer's disease, unspecified: Secondary | ICD-10-CM | POA: Diagnosis present

## 2013-07-31 DIAGNOSIS — N183 Chronic kidney disease, stage 3 unspecified: Secondary | ICD-10-CM

## 2013-07-31 DIAGNOSIS — I4891 Unspecified atrial fibrillation: Secondary | ICD-10-CM | POA: Diagnosis not present

## 2013-07-31 DIAGNOSIS — G589 Mononeuropathy, unspecified: Secondary | ICD-10-CM | POA: Diagnosis present

## 2013-07-31 DIAGNOSIS — F17200 Nicotine dependence, unspecified, uncomplicated: Secondary | ICD-10-CM | POA: Diagnosis present

## 2013-07-31 DIAGNOSIS — N390 Urinary tract infection, site not specified: Secondary | ICD-10-CM | POA: Diagnosis present

## 2013-07-31 DIAGNOSIS — I10 Essential (primary) hypertension: Secondary | ICD-10-CM

## 2013-07-31 DIAGNOSIS — S72141A Displaced intertrochanteric fracture of right femur, initial encounter for closed fracture: Secondary | ICD-10-CM

## 2013-07-31 DIAGNOSIS — G912 (Idiopathic) normal pressure hydrocephalus: Secondary | ICD-10-CM

## 2013-07-31 DIAGNOSIS — S72143A Displaced intertrochanteric fracture of unspecified femur, initial encounter for closed fracture: Secondary | ICD-10-CM | POA: Diagnosis present

## 2013-07-31 DIAGNOSIS — I4892 Unspecified atrial flutter: Secondary | ICD-10-CM

## 2013-07-31 DIAGNOSIS — Z66 Do not resuscitate: Secondary | ICD-10-CM | POA: Diagnosis present

## 2013-07-31 DIAGNOSIS — S72009A Fracture of unspecified part of neck of unspecified femur, initial encounter for closed fracture: Secondary | ICD-10-CM

## 2013-07-31 DIAGNOSIS — D62 Acute posthemorrhagic anemia: Secondary | ICD-10-CM | POA: Diagnosis not present

## 2013-07-31 DIAGNOSIS — F0280 Dementia in other diseases classified elsewhere without behavioral disturbance: Secondary | ICD-10-CM | POA: Diagnosis present

## 2013-07-31 DIAGNOSIS — R509 Fever, unspecified: Secondary | ICD-10-CM | POA: Diagnosis not present

## 2013-07-31 DIAGNOSIS — F0391 Unspecified dementia with behavioral disturbance: Secondary | ICD-10-CM

## 2013-07-31 DIAGNOSIS — R1314 Dysphagia, pharyngoesophageal phase: Secondary | ICD-10-CM

## 2013-07-31 DIAGNOSIS — E785 Hyperlipidemia, unspecified: Secondary | ICD-10-CM | POA: Diagnosis present

## 2013-07-31 DIAGNOSIS — G43909 Migraine, unspecified, not intractable, without status migrainosus: Secondary | ICD-10-CM | POA: Diagnosis present

## 2013-07-31 DIAGNOSIS — Y92129 Unspecified place in nursing home as the place of occurrence of the external cause: Secondary | ICD-10-CM

## 2013-07-31 DIAGNOSIS — I129 Hypertensive chronic kidney disease with stage 1 through stage 4 chronic kidney disease, or unspecified chronic kidney disease: Secondary | ICD-10-CM | POA: Diagnosis present

## 2013-07-31 DIAGNOSIS — E039 Hypothyroidism, unspecified: Secondary | ICD-10-CM | POA: Diagnosis present

## 2013-07-31 DIAGNOSIS — Z982 Presence of cerebrospinal fluid drainage device: Secondary | ICD-10-CM | POA: Diagnosis not present

## 2013-07-31 DIAGNOSIS — Z9181 History of falling: Secondary | ICD-10-CM

## 2013-07-31 DIAGNOSIS — I480 Paroxysmal atrial fibrillation: Secondary | ICD-10-CM

## 2013-07-31 DIAGNOSIS — R5082 Postprocedural fever: Secondary | ICD-10-CM

## 2013-07-31 DIAGNOSIS — W19XXXA Unspecified fall, initial encounter: Secondary | ICD-10-CM | POA: Diagnosis present

## 2013-07-31 DIAGNOSIS — R1310 Dysphagia, unspecified: Secondary | ICD-10-CM | POA: Diagnosis present

## 2013-07-31 DIAGNOSIS — F03918 Unspecified dementia, unspecified severity, with other behavioral disturbance: Secondary | ICD-10-CM | POA: Diagnosis present

## 2013-07-31 DIAGNOSIS — M25559 Pain in unspecified hip: Secondary | ICD-10-CM | POA: Diagnosis present

## 2013-07-31 DIAGNOSIS — Z8546 Personal history of malignant neoplasm of prostate: Secondary | ICD-10-CM | POA: Diagnosis not present

## 2013-07-31 DIAGNOSIS — R451 Restlessness and agitation: Secondary | ICD-10-CM

## 2013-07-31 DIAGNOSIS — N289 Disorder of kidney and ureter, unspecified: Secondary | ICD-10-CM

## 2013-07-31 DIAGNOSIS — R413 Other amnesia: Secondary | ICD-10-CM

## 2013-07-31 DIAGNOSIS — Z515 Encounter for palliative care: Secondary | ICD-10-CM

## 2013-07-31 DIAGNOSIS — I471 Supraventricular tachycardia: Secondary | ICD-10-CM

## 2013-07-31 DIAGNOSIS — G919 Hydrocephalus, unspecified: Secondary | ICD-10-CM

## 2013-07-31 HISTORY — PX: INTRAMEDULLARY (IM) NAIL INTERTROCHANTERIC: SHX5875

## 2013-07-31 LAB — CBC WITH DIFFERENTIAL/PLATELET
BASOS PCT: 0 % (ref 0–1)
Basophils Absolute: 0 10*3/uL (ref 0.0–0.1)
EOS ABS: 0.1 10*3/uL (ref 0.0–0.7)
Eosinophils Relative: 0 % (ref 0–5)
HCT: 32.7 % — ABNORMAL LOW (ref 39.0–52.0)
Hemoglobin: 11.4 g/dL — ABNORMAL LOW (ref 13.0–17.0)
Lymphocytes Relative: 6 % — ABNORMAL LOW (ref 12–46)
Lymphs Abs: 0.8 10*3/uL (ref 0.7–4.0)
MCH: 30.7 pg (ref 26.0–34.0)
MCHC: 34.9 g/dL (ref 30.0–36.0)
MCV: 88.1 fL (ref 78.0–100.0)
MONOS PCT: 6 % (ref 3–12)
Monocytes Absolute: 0.8 10*3/uL (ref 0.1–1.0)
NEUTROS PCT: 88 % — AB (ref 43–77)
Neutro Abs: 12.4 10*3/uL — ABNORMAL HIGH (ref 1.7–7.7)
PLATELETS: 304 10*3/uL (ref 150–400)
RBC: 3.71 MIL/uL — ABNORMAL LOW (ref 4.22–5.81)
RDW: 12.2 % (ref 11.5–15.5)
WBC: 14.1 10*3/uL — ABNORMAL HIGH (ref 4.0–10.5)

## 2013-07-31 LAB — URINALYSIS, ROUTINE W REFLEX MICROSCOPIC
Bilirubin Urine: NEGATIVE
Bilirubin Urine: NEGATIVE
GLUCOSE, UA: NEGATIVE mg/dL
Glucose, UA: NEGATIVE mg/dL
Hgb urine dipstick: NEGATIVE
KETONES UR: NEGATIVE mg/dL
KETONES UR: NEGATIVE mg/dL
NITRITE: NEGATIVE
Nitrite: NEGATIVE
PH: 5 (ref 5.0–8.0)
PROTEIN: 30 mg/dL — AB
Protein, ur: 30 mg/dL — AB
SPECIFIC GRAVITY, URINE: 1.029 (ref 1.005–1.030)
Specific Gravity, Urine: 1.022 (ref 1.005–1.030)
UROBILINOGEN UA: 0.2 mg/dL (ref 0.0–1.0)
Urobilinogen, UA: 0.2 mg/dL (ref 0.0–1.0)
pH: 5.5 (ref 5.0–8.0)

## 2013-07-31 LAB — URINE MICROSCOPIC-ADD ON

## 2013-07-31 LAB — COMPREHENSIVE METABOLIC PANEL
ALBUMIN: 3.4 g/dL — AB (ref 3.5–5.2)
ALK PHOS: 74 U/L (ref 39–117)
ALT: 10 U/L (ref 0–53)
ANION GAP: 15 (ref 5–15)
AST: 15 U/L (ref 0–37)
BUN: 28 mg/dL — AB (ref 6–23)
CO2: 23 mEq/L (ref 19–32)
Calcium: 9 mg/dL (ref 8.4–10.5)
Chloride: 99 mEq/L (ref 96–112)
Creatinine, Ser: 1.83 mg/dL — ABNORMAL HIGH (ref 0.50–1.35)
GFR calc Af Amer: 40 mL/min — ABNORMAL LOW (ref >90)
GFR calc non Af Amer: 34 mL/min — ABNORMAL LOW (ref >90)
Glucose, Bld: 164 mg/dL — ABNORMAL HIGH (ref 70–99)
POTASSIUM: 3.8 meq/L (ref 3.7–5.3)
Sodium: 137 mEq/L (ref 137–147)
Total Bilirubin: 0.4 mg/dL (ref 0.3–1.2)
Total Protein: 7 g/dL (ref 6.0–8.3)

## 2013-07-31 LAB — PROTIME-INR
INR: 1.07 (ref 0.00–1.49)
Prothrombin Time: 13.9 seconds (ref 11.6–15.2)

## 2013-07-31 LAB — TYPE AND SCREEN
ABO/RH(D): O POS
Antibody Screen: NEGATIVE

## 2013-07-31 LAB — APTT: APTT: 28 s (ref 24–37)

## 2013-07-31 LAB — TSH: TSH: 8.05 u[IU]/mL — ABNORMAL HIGH (ref 0.350–4.500)

## 2013-07-31 LAB — ABO/RH: ABO/RH(D): O POS

## 2013-07-31 SURGERY — FIXATION, FRACTURE, INTERTROCHANTERIC, WITH INTRAMEDULLARY ROD
Anesthesia: General | Laterality: Right

## 2013-07-31 MED ORDER — ARTIFICIAL TEARS OP OINT
TOPICAL_OINTMENT | OPHTHALMIC | Status: DC | PRN
  Administered 2013-07-31: 1 via OPHTHALMIC

## 2013-07-31 MED ORDER — VECURONIUM BROMIDE 10 MG IV SOLR
INTRAVENOUS | Status: AC
  Filled 2013-07-31: qty 10

## 2013-07-31 MED ORDER — MENTHOL 3 MG MT LOZG: 1.0000 | LOZENGE | OROMUCOSAL | Status: DC | PRN

## 2013-07-31 MED ORDER — PHENOL 1.4 % MT LIQD: 1.0000 | OROMUCOSAL | Status: DC | PRN

## 2013-07-31 MED ORDER — ENOXAPARIN SODIUM 40 MG/0.4ML ~~LOC~~ SOLN
40.0000 mg | SUBCUTANEOUS | Status: DC
  Administered 2013-08-01 – 2013-08-08 (×8): 40 mg via SUBCUTANEOUS
  Filled 2013-07-31 (×9): qty 0.4

## 2013-07-31 MED ORDER — ACETAMINOPHEN 650 MG RE SUPP: 650.0000 mg | Freq: Four times a day (QID) | RECTAL | Status: DC | PRN

## 2013-07-31 MED ORDER — CEFAZOLIN SODIUM-DEXTROSE 2-3 GM-% IV SOLR
INTRAVENOUS | Status: AC
  Filled 2013-07-31: qty 50

## 2013-07-31 MED ORDER — FENTANYL CITRATE 0.05 MG/ML IJ SOLN
50.0000 ug | INTRAMUSCULAR | Status: DC | PRN
  Filled 2013-07-31: qty 2

## 2013-07-31 MED ORDER — DEXTROSE 5 % IV SOLN
1.0000 g | INTRAVENOUS | Status: DC
  Administered 2013-07-31 – 2013-08-01 (×2): 1 g via INTRAVENOUS
  Filled 2013-07-31 (×3): qty 10

## 2013-07-31 MED ORDER — STERILE WATER FOR INJECTION IJ SOLN
INTRAMUSCULAR | Status: AC
  Filled 2013-07-31: qty 10

## 2013-07-31 MED ORDER — RISPERIDONE 0.25 MG PO TABS
0.2500 mg | ORAL_TABLET | Freq: Two times a day (BID) | ORAL | Status: DC | PRN
  Administered 2013-07-31: 0.25 mg via ORAL
  Filled 2013-07-31 (×2): qty 1

## 2013-07-31 MED ORDER — LABETALOL HCL 5 MG/ML IV SOLN
INTRAVENOUS | Status: AC
  Filled 2013-07-31: qty 4

## 2013-07-31 MED ORDER — ENOXAPARIN SODIUM 40 MG/0.4ML ~~LOC~~ SOLN
40.0000 mg | SUBCUTANEOUS | Status: DC
  Filled 2013-07-31: qty 0.4

## 2013-07-31 MED ORDER — MEPERIDINE HCL 25 MG/ML IJ SOLN: 6.2500 mg | INTRAMUSCULAR | Status: DC | PRN

## 2013-07-31 MED ORDER — ONDANSETRON HCL 4 MG PO TABS: 4.0000 mg | ORAL_TABLET | Freq: Four times a day (QID) | ORAL | Status: DC | PRN

## 2013-07-31 MED ORDER — GLYCOPYRROLATE 0.2 MG/ML IJ SOLN
INTRAMUSCULAR | Status: DC | PRN
  Administered 2013-07-31: 0.6 mg via INTRAVENOUS

## 2013-07-31 MED ORDER — LACTATED RINGERS IV SOLN
INTRAVENOUS | Status: DC | PRN
Start: 2013-07-31 — End: 2013-07-31
  Administered 2013-07-31 (×2): via INTRAVENOUS

## 2013-07-31 MED ORDER — LABETALOL HCL 5 MG/ML IV SOLN
10.0000 mg | INTRAVENOUS | Status: DC | PRN
  Administered 2013-07-31: 5 mg via INTRAVENOUS

## 2013-07-31 MED ORDER — SODIUM CHLORIDE 0.9 % IV SOLN
10.0000 mg | INTRAVENOUS | Status: DC | PRN
  Administered 2013-07-31: 15 ug/min via INTRAVENOUS

## 2013-07-31 MED ORDER — NEOSTIGMINE METHYLSULFATE 10 MG/10ML IV SOLN
INTRAVENOUS | Status: DC | PRN
  Administered 2013-07-31: 5 mg via INTRAVENOUS

## 2013-07-31 MED ORDER — LORAZEPAM 2 MG/ML IJ SOLN
1.0000 mg | Freq: Once | INTRAMUSCULAR | Status: AC
  Administered 2013-07-31: 1 mg via INTRAVENOUS
  Filled 2013-07-31: qty 1

## 2013-07-31 MED ORDER — OXYCODONE HCL 5 MG/5ML PO SOLN: 5.0000 mg | Freq: Once | ORAL | Status: DC | PRN

## 2013-07-31 MED ORDER — CEFAZOLIN SODIUM-DEXTROSE 2-3 GM-% IV SOLR: 2.0000 g | INTRAVENOUS | Status: DC

## 2013-07-31 MED ORDER — OXYCODONE HCL 5 MG PO TABS: 5.0000 mg | ORAL_TABLET | Freq: Once | ORAL | Status: DC | PRN

## 2013-07-31 MED ORDER — PANTOPRAZOLE SODIUM 40 MG PO TBEC
40.0000 mg | DELAYED_RELEASE_TABLET | Freq: Every day | ORAL | Status: DC
  Administered 2013-08-01 – 2013-08-07 (×7): 40 mg via ORAL
  Filled 2013-07-31 (×6): qty 1

## 2013-07-31 MED ORDER — ONDANSETRON HCL 4 MG/2ML IJ SOLN: 4.0000 mg | Freq: Four times a day (QID) | INTRAMUSCULAR | Status: DC | PRN

## 2013-07-31 MED ORDER — PROPOFOL 10 MG/ML IV BOLUS
INTRAVENOUS | Status: DC | PRN
  Administered 2013-07-31: 100 mg via INTRAVENOUS

## 2013-07-31 MED ORDER — DILTIAZEM HCL ER COATED BEADS 240 MG PO CP24
240.0000 mg | ORAL_CAPSULE | Freq: Every day | ORAL | Status: DC
  Administered 2013-07-31 – 2013-08-04 (×5): 240 mg via ORAL
  Filled 2013-07-31 (×5): qty 1

## 2013-07-31 MED ORDER — NEOSTIGMINE METHYLSULFATE 10 MG/10ML IV SOLN
INTRAVENOUS | Status: AC
  Filled 2013-07-31: qty 1

## 2013-07-31 MED ORDER — MIDAZOLAM HCL 2 MG/2ML IJ SOLN
INTRAMUSCULAR | Status: AC
Start: 2013-07-31 — End: 2013-07-31
  Filled 2013-07-31: qty 2

## 2013-07-31 MED ORDER — FENTANYL CITRATE 0.05 MG/ML IJ SOLN
50.0000 ug | Freq: Once | INTRAMUSCULAR | Status: AC
  Administered 2013-07-31: 50 ug via INTRAVENOUS
  Filled 2013-07-31: qty 2

## 2013-07-31 MED ORDER — SODIUM CHLORIDE 0.9 % IV SOLN
INTRAVENOUS | Status: DC
  Administered 2013-08-04: 17:00:00 via INTRAVENOUS

## 2013-07-31 MED ORDER — LEVOTHYROXINE SODIUM 150 MCG PO TABS
150.0000 ug | ORAL_TABLET | Freq: Every day | ORAL | Status: DC
  Administered 2013-08-01 – 2013-08-06 (×6): 150 ug via ORAL
  Filled 2013-07-31 (×8): qty 1

## 2013-07-31 MED ORDER — SODIUM CHLORIDE 0.9 % IJ SOLN
3.0000 mL | Freq: Two times a day (BID) | INTRAMUSCULAR | Status: DC
  Administered 2013-07-31: 3 mL via INTRAVENOUS

## 2013-07-31 MED ORDER — LIDOCAINE HCL (CARDIAC) 20 MG/ML IV SOLN
INTRAVENOUS | Status: DC | PRN
  Administered 2013-07-31: 60 mg via INTRAVENOUS

## 2013-07-31 MED ORDER — ENOXAPARIN SODIUM 40 MG/0.4ML ~~LOC~~ SOLN: 40.0000 mg | SUBCUTANEOUS | Status: DC

## 2013-07-31 MED ORDER — ONDANSETRON HCL 4 MG/2ML IJ SOLN
4.0000 mg | Freq: Four times a day (QID) | INTRAMUSCULAR | Status: DC | PRN
  Administered 2013-08-08: 4 mg via INTRAVENOUS
  Filled 2013-07-31: qty 2

## 2013-07-31 MED ORDER — HALOPERIDOL LACTATE 5 MG/ML IJ SOLN
2.0000 mg | Freq: Once | INTRAMUSCULAR | Status: DC
  Filled 2013-07-31: qty 1

## 2013-07-31 MED ORDER — SERTRALINE HCL 100 MG PO TABS
100.0000 mg | ORAL_TABLET | Freq: Every day | ORAL | Status: DC
  Administered 2013-07-31 – 2013-08-08 (×9): 100 mg via ORAL
  Filled 2013-07-31 (×9): qty 1

## 2013-07-31 MED ORDER — CEFAZOLIN SODIUM-DEXTROSE 2-3 GM-% IV SOLR
INTRAVENOUS | Status: DC | PRN
  Administered 2013-07-31: 2 g via INTRAVENOUS

## 2013-07-31 MED ORDER — LIDOCAINE HCL (CARDIAC) 20 MG/ML IV SOLN
INTRAVENOUS | Status: AC
  Filled 2013-07-31: qty 10

## 2013-07-31 MED ORDER — ONDANSETRON HCL 4 MG/2ML IJ SOLN
INTRAMUSCULAR | Status: DC | PRN
  Administered 2013-07-31: 4 mg via INTRAVENOUS

## 2013-07-31 MED ORDER — VECURONIUM BROMIDE 10 MG IV SOLR
INTRAVENOUS | Status: DC | PRN
  Administered 2013-07-31: 4 mg via INTRAVENOUS
  Administered 2013-07-31: 3 mg via INTRAVENOUS

## 2013-07-31 MED ORDER — 0.9 % SODIUM CHLORIDE (POUR BTL) OPTIME
TOPICAL | Status: DC | PRN
  Administered 2013-07-31: 1000 mL

## 2013-07-31 MED ORDER — SUCCINYLCHOLINE CHLORIDE 20 MG/ML IJ SOLN
INTRAMUSCULAR | Status: DC | PRN
  Administered 2013-07-31: 140 mg via INTRAVENOUS

## 2013-07-31 MED ORDER — SUCCINYLCHOLINE CHLORIDE 20 MG/ML IJ SOLN
INTRAMUSCULAR | Status: AC
  Filled 2013-07-31: qty 1

## 2013-07-31 MED ORDER — SENNA 8.6 MG PO TABS
2.0000 | ORAL_TABLET | Freq: Two times a day (BID) | ORAL | Status: DC
  Administered 2013-07-31 – 2013-08-07 (×11): 17.2 mg via ORAL
  Filled 2013-07-31 (×17): qty 2

## 2013-07-31 MED ORDER — FENTANYL CITRATE 0.05 MG/ML IJ SOLN
INTRAMUSCULAR | Status: DC | PRN
  Administered 2013-07-31: 150 ug via INTRAVENOUS
  Administered 2013-07-31 (×2): 50 ug via INTRAVENOUS

## 2013-07-31 MED ORDER — DEXTROSE 5 % IV SOLN
INTRAVENOUS | Status: DC | PRN
  Administered 2013-07-31: 16:00:00 via INTRAVENOUS

## 2013-07-31 MED ORDER — FENTANYL CITRATE 0.05 MG/ML IJ SOLN
INTRAMUSCULAR | Status: AC
  Filled 2013-07-31: qty 5

## 2013-07-31 MED ORDER — DOCUSATE SODIUM 100 MG PO CAPS
100.0000 mg | ORAL_CAPSULE | Freq: Two times a day (BID) | ORAL | Status: DC
  Administered 2013-07-31 – 2013-08-07 (×12): 100 mg via ORAL
  Filled 2013-07-31 (×13): qty 1

## 2013-07-31 MED ORDER — ONDANSETRON HCL 4 MG/2ML IJ SOLN
INTRAMUSCULAR | Status: AC
  Filled 2013-07-31: qty 4

## 2013-07-31 MED ORDER — ACETAMINOPHEN 325 MG PO TABS
650.0000 mg | ORAL_TABLET | Freq: Four times a day (QID) | ORAL | Status: DC | PRN
  Administered 2013-07-31 – 2013-08-03 (×4): 650 mg via ORAL
  Filled 2013-07-31 (×5): qty 2

## 2013-07-31 MED ORDER — PROPOFOL 10 MG/ML IV BOLUS
INTRAVENOUS | Status: AC
  Filled 2013-07-31: qty 20

## 2013-07-31 MED ORDER — NEOSTIGMINE METHYLSULFATE 10 MG/10ML IV SOLN
INTRAVENOUS | Status: AC
  Filled 2013-07-31: qty 2

## 2013-07-31 MED ORDER — SODIUM CHLORIDE 0.9 % IV SOLN
INTRAVENOUS | Status: DC
  Administered 2013-08-02: 16:00:00 via INTRAVENOUS

## 2013-07-31 MED ORDER — HYDROMORPHONE HCL PF 1 MG/ML IJ SOLN: 0.2500 mg | INTRAMUSCULAR | Status: DC | PRN

## 2013-07-31 MED ORDER — ONDANSETRON HCL 4 MG/2ML IJ SOLN: 4.0000 mg | Freq: Once | INTRAMUSCULAR | Status: DC | PRN

## 2013-07-31 MED ORDER — GLYCOPYRROLATE 0.2 MG/ML IJ SOLN
INTRAMUSCULAR | Status: AC
  Filled 2013-07-31: qty 3

## 2013-07-31 SURGICAL SUPPLY — 39 items
BIT DRILL 4.3MMS DISTAL GRDTED (BIT) ×1 IMPLANT
BLADE SURG 15 STRL LF DISP TIS (BLADE) ×1 IMPLANT
BLADE SURG 15 STRL SS (BLADE) ×2
CANISTER SUCT 3000ML (MISCELLANEOUS) ×3 IMPLANT
CHLORAPREP W/TINT 26ML (MISCELLANEOUS) ×3 IMPLANT
COVER PERINEAL POST (MISCELLANEOUS) ×3 IMPLANT
DRAPE PROXIMA HALF (DRAPES) ×3 IMPLANT
DRILL 4.3MMS DISTAL GRADUATED (BIT) ×3
DRSG MEPILEX BORDER 4X12 (GAUZE/BANDAGES/DRESSINGS) ×3 IMPLANT
DRSG MEPILEX BORDER 4X4 (GAUZE/BANDAGES/DRESSINGS) IMPLANT
DRSG MEPILEX BORDER 4X8 (GAUZE/BANDAGES/DRESSINGS) IMPLANT
ELECT REM PT RETURN 9FT ADLT (ELECTROSURGICAL) ×3
ELECTRODE REM PT RTRN 9FT ADLT (ELECTROSURGICAL) ×1 IMPLANT
GLOVE BIO SURGEON STRL SZ8 (GLOVE) ×3 IMPLANT
GLOVE BIOGEL PI IND STRL 8 (GLOVE) ×1 IMPLANT
GLOVE BIOGEL PI INDICATOR 8 (GLOVE) ×2
GOWN STRL REUS W/ TWL LRG LVL3 (GOWN DISPOSABLE) ×1 IMPLANT
GOWN STRL REUS W/ TWL XL LVL3 (GOWN DISPOSABLE) ×2 IMPLANT
GOWN STRL REUS W/TWL LRG LVL3 (GOWN DISPOSABLE) ×2
GOWN STRL REUS W/TWL XL LVL3 (GOWN DISPOSABLE) ×4
GUIDEPIN 3.2X17.5 THRD DISP (PIN) ×6 IMPLANT
GUIDEWIRE BALL NOSE 100CM (WIRE) ×3 IMPLANT
HFN LAG SCREW 10.5MM X 115MM (Orthopedic Implant) ×3 IMPLANT
HFN RH 130 DEG 11MM X 420MM (Nail) ×3 IMPLANT
KIT BASIN OR (CUSTOM PROCEDURE TRAY) ×3 IMPLANT
KIT ROOM TURNOVER OR (KITS) ×3 IMPLANT
LINER BOOT UNIVERSAL DISP (MISCELLANEOUS) ×3 IMPLANT
NS IRRIG 1000ML POUR BTL (IV SOLUTION) ×3 IMPLANT
PACK GENERAL/GYN (CUSTOM PROCEDURE TRAY) ×3 IMPLANT
PAD ARMBOARD 7.5X6 YLW CONV (MISCELLANEOUS) ×6 IMPLANT
SCREW ANTI-ROTATION 75MM (Screw) ×3 IMPLANT
SCREW BONE CORTICAL 5.0X42 (Screw) ×3 IMPLANT
SCREW DRILL BIT ANIT ROTATION (BIT) ×3 IMPLANT
SCREWDRIVER DIST 3.5 281001019 (MISCELLANEOUS) ×3 IMPLANT
SPONGE LAP 18X18 X RAY DECT (DISPOSABLE) ×3 IMPLANT
STAPLER VISISTAT 35W (STAPLE) ×6 IMPLANT
SUT MNCRL AB 3-0 PS2 18 (SUTURE) ×3 IMPLANT
TRAY FOLEY CATH 16FRSI W/METER (SET/KITS/TRAYS/PACK) IMPLANT
WATER STERILE IRR 1000ML POUR (IV SOLUTION) ×3 IMPLANT

## 2013-07-31 NOTE — Anesthesia Preprocedure Evaluation (Signed)
Anesthesia Evaluation  Patient identified by MRN, date of birth, ID band Patient awake    Reviewed: Allergy & Precautions, H&P , NPO status , Patient's Chart, lab work & pertinent test results  Airway Mallampati: II TM Distance: >3 FB Neck ROM: Full    Dental   Pulmonary Current Smoker,          Cardiovascular hypertension, Pt. on medications     Neuro/Psych Dementia   GI/Hepatic   Endo/Other  Hypothyroidism   Renal/GU CRFRenal disease     Musculoskeletal   Abdominal   Peds  Hematology   Anesthesia Other Findings   Reproductive/Obstetrics                           Anesthesia Physical Anesthesia Plan  ASA: III  Anesthesia Plan: General   Post-op Pain Management:    Induction: Intravenous  Airway Management Planned: Oral ETT  Additional Equipment:   Intra-op Plan:   Post-operative Plan: Extubation in OR  Informed Consent: I have reviewed the patients History and Physical, chart, labs and discussed the procedure including the risks, benefits and alternatives for the proposed anesthesia with the patient or authorized representative who has indicated his/her understanding and acceptance.     Plan Discussed with: CRNA and Surgeon  Anesthesia Plan Comments:         Anesthesia Quick Evaluation

## 2013-07-31 NOTE — ED Notes (Signed)
Pt's meds that were held earlier in ED were not returned to [facility] prior to pt being transferred. Due to this, unable to return to or waste remainder in [place]. Per Pharmacy instruction, meds wasted in sharps box in med room with witness Enid Cutter, RN. Haldol 5mg  IV, Fentanyl 32mcg IV (21mcg wasted at time meds were taken out of [facility] originally). Neither of these meds given.

## 2013-07-31 NOTE — Op Note (Signed)
NAMEMarland Kitchen  Walter Carey, Walter Carey NO.:  [id number]  MEDICAL RECORD NO.:  [id number]  LOCATION:  [id number]                        FACILITY:  [facility]  PHYSICIAN:  [name], MD        DATE OF BIRTH:  [birth year]  DATE OF PROCEDURE:  07/31/2013 DATE OF DISCHARGE:                              OPERATIVE REPORT   PREOPERATIVE DIAGNOSIS:  Right hip intertrochanteric fracture.  POSTOPERATIVE DIAGNOSIS:  Right hip intertrochanteric fracture.  PROCEDURE:  Open treatment of right hip intertrochanteric fracture with intramedullary nailing.  SURGEON:  [name], MD  ANESTHESIA:  General.  ESTIMATED BLOOD LOSS:  100 mL.  TOURNIQUET TIME:  Zero.  COMPLICATIONS:  None apparent.  DISPOSITION:  Extubated, awake, and stable to recovery.  INDICATIONS FOR PROCEDURE:  The patient is a 79 year old male with past medical history significant for dementia.  He fell earlier today injuring the right hip.  Radiographs in the Emergency Department revealed an comminuted intertrochanteric fracture.  He presents now for operative treatment of this fracture.  His wife understands the risks, benefits, the alternative treatment options, and elects surgical treatment.  She specifically understands risks of bleeding, infection, nerve damage, blood clots, need for additional surgery, continued pain, amputation, and death.  PROCEDURE IN DETAIL:  After preoperative consent was obtained and the correct operative site was identified, the patient was brought to the operating room supine on a hospital bed.  General anesthesia was induced.  Preoperative antibiotics were administered.  Surgical time-out was taken.  The patient was then moved over onto the operating table. The left lower extremity was positioned in a well leg holder.  The right lower extremity was placed in a well-padded traction boot.  A reduction maneuver of traction, abduction and external rotation was then followed by adduction and  internal rotation.  AP and lateral radiographs showed reasonable alignment given the severe comminution at the intertrochanteric region.  The right lower extremity was then prepped and draped in standard sterile fashion.  A longitudinal incision was made just proximal from the tip of the greater trochanter.  Sharp dissection was carried down through the skin and subcutaneous tissue and IT band.  The fibers of gluteus were divided longitudinally and the tip of the greater trochanter was identified.  A guide pin was then inserted down through the tip of the trochanter into the medullary canal.  The starting awl was introduced over the guide pin to open the femoral canal.  The starting guide pin was removed and a ball-tip guidewire was inserted and advanced down to the level of the knee.  AP and lateral radiographs of the knee confirmed intramedullary positioning of the guide pin in appropriate depth of the guide pin.  The guide pin was then over reamed with a 13 mm reamer.  An 11 mm nail from the [employer] set was then inserted over the guidewire and seated adjacent to the inferior femoral neck.  AP and lateral radiographs confirmed appropriate position of the nail.  The external alignment guide was then used to target the femoral head for a lag screw.  An incision was made at the location of the targeting guide and  was extended proximally.  A bone hook was inserted over the femoral neck to reduce the flexed proximal fragment.  The fragment was held in a reduced position while 2 guidewires were inserted through the alignment guide into the femoral head in the appropriate position.  AP and lateral radiographs confirmed appropriate positioning of both guide pins.  The proximal guide pin was removed and the position was drilled for a derotation screw.  A 75 mm derotation screw was inserted and advanced to the lateral femoral cortex.  The second more distal pin was over drilled and a lag  screw was inserted.  AP and lateral radiographs confirmed appropriate positioning relative to the center of the femoral head.  The screws were secured to the nail by tightening the proximal locking screw completely.  Targeting guide was then removed in its entirety.  The perfect circle technique was then used to insert a single distal interlocking screw in the static hole  at the tip of the nail.  Final AP and lateral radiographs at the fracture site and at the level of the distal interlock showed appropriate position and length of all hardware and appropriate reduction of the fracture.  All the wounds were then irrigated copiously.  The deep fascia was closed with simple sutures of 0 Vicryl. Subcutaneous tissue was approximated in superficially with inverted simple sutures of 2-0 Vicryl.  Staples were used to close the skin incision.  Sterile dressings were applied.  The patient was awakened from anesthesia and transported to the recovery room in stable condition.  FOLLOWUP PLAN:  The patient will be weightbearing as tolerated on his right lower extremity.  He will have physical therapy, occupational therapy, and social work consult for discharge planning.  He will start Lovenox for DVT prophylaxis tomorrow.     [name], MD     JH/MEDQ  D:  07/31/2013  T:  07/31/2013  Job:  [id number]

## 2013-07-31 NOTE — H&P (Signed)
[facility] History and Physical  Walter Carey YYT:[id number] DOB: April 30, 1934 DOA: 07/31/2013  Referring physician:  PCP: Marjorie Smolder, MD  Specialists:   Chief Complaint: fall, hip fracture   HPI: Walter Carey is a 79 y.o. male with PMH of HTN, CKD, hypothyroidism, h/o prostate CA, dementia with NPH s/p VP shunting, frequent falls, prn wheelchair for the last few years presented from SNF another episode of fall, found to have hip fracture;  He was found on the floor in his room, complaining pain in his right hip area. Patient was stabilized by EMS and transported to the ED; Pt denies SOB, no chest pain, no focal neuro weakness, no LOC, no nausea, vomiting or diarrhea, no fever,chills;  -ED, Dr. Tomi Bamberger d/w ortho,who is planning to takle him to OR today, requested to TF to [facility]    Review of Systems: The patient denies anorexia, fever, weight loss,, vision loss, decreased hearing, hoarseness, chest pain, syncope, dyspnea on exertion, peripheral edema, balance deficits, hemoptysis, abdominal pain, melena, hematochezia, severe indigestion/heartburn, hematuria, incontinence, genital sores, muscle weakness, suspicious skin lesions, transient blindness, difficulty walking, depression, unusual weight change, abnormal bleeding, enlarged lymph nodes, angioedema, and breast masses.    Past Medical History  Diagnosis Date  . Hypertension   . Dementia   . Hydrocephalus with operating shunt   . Neuropathy   . AVNRT (AV nodal re-entry tachycardia)   . Falls   . CKD (chronic kidney disease), stage III   . Normal pressure hydrocephalus 12/24/2012  . Abnormality of gait 12/24/2012  . Dyslipidemia   . Hypothyroidism   . Personal history of subdural hematoma     Bilateral, following VP shunt placement  . Migraine headache   . Cancer     Prostate cancer  . Memory loss    Past Surgical History  Procedure Laterality Date  . Carotid artery angioplasty    . Cystostomy w/ bladder biopsy    .  Ventriculoperitoneal shunt      PROGRAMMABLE  . Cataract extraction Bilateral   . Shoulder surgery Left     Bone spur  . Carotid endarterectomy      Right   Social History:  reports that he has been smoking.  He has never used smokeless tobacco. He reports that he does not drink alcohol or use illicit drugs. SNF,  where does patient live--home, ALF, SNF? and with whom if at home? No;  Can patient participate in ADLs?  Allergies  Allergen Reactions  . Asa [Aspirin]   . Erythromycin   . Morphine And Related   . Oxycodone   . Stadol [Butorphanol]   . Tylox [Oxycodone-Acetaminophen]   . Versed [Midazolam]     Family History  Problem Relation Age of Onset  . Stroke Mother   . Diabetes Father     (be sure to complete)  Prior to Admission medications   Medication Sig Start Date End Date Taking? Authorizing Provider  amitriptyline (ELAVIL) 25 MG tablet Take 25 mg by mouth at bedtime.   Yes Historical Provider, MD  CALCIUM-VITAMIN D PO Take 1 tablet by mouth daily.    Yes Historical Provider, MD  diltiazem (CARDIZEM CD) 240 MG 24 hr capsule Take 1 capsule (240 mg total) by mouth daily. 07/13/12  Yes Debbe Odea, MD  donepezil (ARICEPT) 10 MG tablet Take 10 mg by mouth at bedtime.   Yes Historical Provider, MD  fish oil-omega-3 fatty acids 1000 MG capsule Take 1 g by mouth 3 (three) times daily.  Yes Historical Provider, MD  levothyroxine (SYNTHROID, LEVOTHROID) 150 MCG tablet Take 150 mcg by mouth daily before breakfast.   Yes Historical Provider, MD  Multiple Vitamin (MULTIVITAMIN WITH MINERALS) TABS tablet Take 1 tablet by mouth daily.   Yes Historical Provider, MD  omeprazole (PRILOSEC) 20 MG capsule Take 20 mg by mouth 2 (two) times daily.   Yes Historical Provider, MD  QUEtiapine (SEROQUEL) 25 MG tablet Take 1 tablet (25 mg total) by mouth at bedtime. 07/13/12  Yes Debbe Odea, MD  risperiDONE (RISPERDAL) 0.25 MG tablet Take 1 tablet (0.25 mg total) by mouth 2 (two) times daily as  needed (for agitation). 07/13/12  Yes Debbe Odea, MD  sertraline (ZOLOFT) 100 MG tablet Take 100 mg by mouth daily.   Yes Historical Provider, MD  thiamine (VITAMIN B-1) 100 MG tablet Take 300 mg by mouth daily.   Yes Historical Provider, MD  vitamin E 400 UNIT capsule Take 400 Units by mouth 2 (two) times daily.   Yes Historical Provider, MD   Physical Exam: Filed Vitals:   07/31/13 1130  BP: 102/63  Pulse: 111  Resp: 17     General:  Alert, confused  Eyes: eom-i  ENT: no oral ulcers  Neck: supple   Cardiovascular: s1,s2 rrr  Respiratory: CTA BL  Abdomen: soft, nt,nd   Skin: no rash   Musculoskeletal: no pedal edema  Psychiatric: no hallucinations   Neurologic: CN 2-12 intact, able to move all extremities; alert but confused at baseline   Labs on Admission:  Basic Metabolic Panel:  Recent Labs Lab 07/31/13 1000  NA 137  K 3.8  CL 99  CO2 23  GLUCOSE 164*  BUN 28*  CREATININE 1.83*  CALCIUM 9.0   Liver Function Tests:  Recent Labs Lab 07/31/13 1000  AST 15  ALT 10  ALKPHOS 74  BILITOT 0.4  PROT 7.0  ALBUMIN 3.4*   No results found for this basename: LIPASE, AMYLASE,  in the last 168 hours No results found for this basename: AMMONIA,  in the last 168 hours CBC:  Recent Labs Lab 07/31/13 1000  WBC 14.1*  NEUTROABS 12.4*  HGB 11.4*  HCT 32.7*  MCV 88.1  PLT 304   Cardiac Enzymes: No results found for this basename: CKTOTAL, CKMB, CKMBINDEX, TROPONINI,  in the last 168 hours  BNP (last 3 results) No results found for this basename: PROBNP,  in the last 8760 hours CBG: No results found for this basename: GLUCAP,  in the last 168 hours  Radiological Exams on Admission: Dg Chest 1 View  07/31/2013   CLINICAL DATA:  fall, hip pain  EXAM: CHEST - 1 VIEW  COMPARISON:  07/10/2012  FINDINGS: VP shunt tubing partially seen over the right hemi thorax. Lungs clear. The mild edema suspected on previous study has resolved. Heart size normal. No  effusion. Regional bones unremarkable.  IMPRESSION: 1. No acute disease   Electronically Signed   By: Arne Cleveland M.D.   On: 07/31/2013 09:42   Dg Hip Complete Right  07/31/2013   CLINICAL DATA:  HIP PAIN FALL  EXAM: RIGHT HIP - COMPLETE 2+ VIEW  COMPARISON:  07/21/2003  FINDINGS: There is a comminuted intertrochanteric fracture of the right femur. There are several cm displacement and foreshortening of major fracture fragments. The lesser trochanter is a separate fracture fragment. Femoral head remains located.  IMPRESSION: 1. Comminuted displaced right intertrochanteric femur fracture.   Electronically Signed   By: Arne Cleveland M.D.   On:  07/31/2013 09:40   Dg Femur Right  07/31/2013   CLINICAL DATA:  HIP PAIN FALL  EXAM: RIGHT FEMUR - 2 VIEW  COMPARISON:  07/21/2003  FINDINGS: Comminuted intertrochanteric fracture of the right femur. Several cm of displacement and override of major fracture fragments. The lesser trochanter is a separate fracture fragment. The femoral head remains seated. Distal femur is intact. Patchy femoral-popliteal arterial calcifications.  IMPRESSION: 1. Comminuted displaced intertrochanteric fracture   Electronically Signed   By: Arne Cleveland M.D.   On: 07/31/2013 09:41   Ct Head Wo Contrast  07/31/2013   CLINICAL DATA:  Fall ; dementia  EXAM: CT HEAD WITHOUT CONTRAST  CT CERVICAL SPINE WITHOUT CONTRAST  COMPARISON:  Brain CT July 04, 2013  TECHNIQUE: Multidetector CT imaging of the head and cervical spine was performed following the standard protocol without intravenous contrast. Multiplanar CT image reconstructions of the cervical spine were also generated.  FINDINGS: CT HEAD: There is diffuse ventricular enlargement with less pronounced sulcal enlargement, stable. There is a shunt catheter with the tip in the right lateral ventricle, stable. There is no intracranial mass, hemorrhage, extra-axial fluid collection or midline shift. There is decreased attenuation  surrounding the lateral ventricles, stable. There are small lacunar type infarcts in the lateral left lentiform nucleus as well as in both external capsules, stable. No new gray-white compartment lesion. No acute infarct.  Bony calvarium appears intact except for a shunt catheter placement defect in the anterior right parietal bone. Mastoid air cells are clear. There is ethmoid sinus disease bilaterally.  CT CERVICAL SPINE: There is no fracture or spondylolisthesis. Prevertebral soft tissues and predental space regions are normal. There is moderate disc space narrowing at C4-5, C5-6, and C6-7. There is bony hypertrophy at most levels with multiple areas of as exit foraminal narrowing due to facet hypertrophy. No disc extrusion or stenosis. There is calcification in both carotid arteries.  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 Stable ventricular enlargement with shunt catheter in place, stable from prior study. Decreased attenuation adjacent to the ventricles is most likely due to a combination of small vessel disease and interstitial edema from the chronic ventricular enlargement. No acute infarct apparent. No hemorrhage or mass effect. There is ethmoid sinus disease bilaterally.  CT cervical spine: Multilevel spondylosis and osteoarthritic change. No fracture or spondylolisthesis. Calcification in both carotid arteries.   Electronically Signed   By: Lowella Grip M.D.   On: 07/31/2013 10:02   Ct Cervical Spine Wo Contrast  07/31/2013   CLINICAL DATA:  Fall ; dementia  EXAM: CT HEAD WITHOUT CONTRAST  CT CERVICAL SPINE WITHOUT CONTRAST  COMPARISON:  Brain CT July 04, 2013  TECHNIQUE: Multidetector CT imaging of the head and cervical spine was performed following the standard protocol without intravenous contrast. Multiplanar CT image reconstructions of the cervical spine were also generated.  FINDINGS: CT HEAD: There is diffuse ventricular enlargement  with less pronounced sulcal enlargement, stable. There is a shunt catheter with the tip in the right lateral ventricle, stable. There is no intracranial mass, hemorrhage, extra-axial fluid collection or midline shift. There is decreased attenuation surrounding the lateral ventricles, stable. There are small lacunar type infarcts in the lateral left lentiform nucleus as well as in both external capsules, stable. No new gray-white compartment lesion. No acute infarct.  Bony calvarium appears intact except for a shunt catheter placement defect in the anterior right parietal bone. Mastoid air cells are clear. There is ethmoid sinus disease bilaterally.  CT CERVICAL SPINE: There is no fracture or spondylolisthesis. Prevertebral soft tissues and predental space regions are normal. There is moderate disc space narrowing at C4-5, C5-6, and C6-7. There is bony hypertrophy at most levels with multiple areas of as exit foraminal narrowing due to facet hypertrophy. No disc extrusion or stenosis. There is calcification in both carotid arteries.  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 Stable ventricular enlargement with shunt catheter in place, stable from prior study. Decreased attenuation adjacent to the ventricles is most likely due to a combination of small vessel disease and interstitial edema from the chronic ventricular enlargement. No acute infarct apparent. No hemorrhage or mass effect. There is ethmoid sinus disease bilaterally.  CT cervical spine: Multilevel spondylosis and osteoarthritic change. No fracture or spondylolisthesis. Calcification in both carotid arteries.   Electronically Signed   By: Lowella Grip M.D.   On: 07/31/2013 10:02    EKG: Independently reviewed.   Assessment/Plan Principal Problem:   Closed intertrochanteric fracture of hip Active Problems:   Dementia   Falls   Normal pressure hydrocephalus   Agitation   79 y.o. male  with PMH of HTN, CKD, hypothyroidism, h/o prostate CA, dementia with NPH s/p VP shunting, frequent falls, prn wheelchair for the last few years presented from SNF another episode of fall, found to have hip fracture;   -ED, Dr. Tomi Bamberger d/w ortho,who is planning to takle him to OR today, requested to TF to [facility]   1. Hip fracture; x ray: Comminuted displaced intertrochanteric fracture -per orthopedics; OR today   2. Fall, frequent falls due to NPH, dementia; cognitive impairment, gait disturbance, and urinary incontinence -s/p VP shunt, likely not helpful since progressive disease;  -hold amitriptyline ? Contributing to fall; fall precautions, supportive care  3. HTN, stable; cont home regimen  4. Probable UTI, leukocytosis; ? Treated at SNF:  -start empiric atx, f/u cultures; if negative cultures will ld/c atx  5. CKD; stable cont monitoring   Prognosis is poor due to advanced dementia, NPH, frequent falls, at risk for infectious complications  D/w patient, his family wife, son at the bedside;  -Patient is DNR       if consultant consulted, please document name and whether formally or informally consulted  Code Status: DNR (must indicate code status--if unknown or must be presumed, indicate so) Family Communication: d/w patient, family  (indicate person spoken with, if applicable, with phone number if by telephone) Disposition Plan: pend surgery  (indicate anticipated LOS)  Time spent: >35 minutes   [place], [facility] Pager [phone]  If 7PM-7AM, please contact night-coverage [email or web address] Password [email or web address] 07/31/2013, 1:13 PM

## 2013-07-31 NOTE — ED Notes (Addendum)
Pt in from [facility] dementia SNF. Unwitnessed fall, found on rounds this am. Unknown downtime. C/o R hip pain, shortening and rotation present. R elbow and shoulder skin tears. Pt denied neck or back pain, immobilized in KED by ems PTA as precaution. Sheet tied tightly around hips for pelvis immobilization by ems. EMS IV 20g L lateral FA. 150 mcg Fentanyl en route.

## 2013-07-31 NOTE — Brief Op Note (Signed)
07/31/2013  5:22 PM  PATIENT:  Walter Carey  78 y.o. male  PRE-OPERATIVE DIAGNOSIS:  RIGHT HIP intertrochanteric FRACTURE  POST-OPERATIVE DIAGNOSIS:  RIGHT HIP intertrochanteric FRACTURE  Procedure(s):  Open treatment of right hip intertrochanteric fracture with intramedullary nailing  SURGEON:  [name], MD  ASSISTANT: n/a  ANESTHESIA:   General  EBL:  100 cc  TOURNIQUET:  n/a  COMPLICATIONS:  None apparent  DISPOSITION:  Extubated, awake and stable to recovery.  DICTATION ID:  [id number]

## 2013-07-31 NOTE — ED Notes (Signed)
PT STILL NOT IN ROOM

## 2013-07-31 NOTE — ED Notes (Signed)
Pt aggressive and refusing to lie still for xray. Pt trying to take off C-collar. Attempting to get out of bed. Turning over on his abdomen. Attempting to kick and hit staff. Risk for additional self-injury. Tomi Bamberger, EDP made aware. Orders received.

## 2013-07-31 NOTE — Progress Notes (Signed)
Patient arrived on floor from PACU around 1820. Vital signs stable. Patient pain free with IV infusing. Dressing intact. Patient can be easily combative. Bed alarm on. Family at bedside. Will continue to monitor

## 2013-07-31 NOTE — Consult Note (Signed)
Reason for Consult:  Right hip injury Referring Physician:  Dr. Shari Heritage D Walter Carey is an 79 y.o. male.  HPI:  79 y/o male with PMH of hydrocephalus and dementia fell this morning at assisted living.  He is accompanied by his son and wife.  He c/o pain in his hip.  Worse with motion.  Better with rest.  No h/o previous right hip injury.  He falls often.  No blood thinners.  Last ate last night.    Past Medical History  Diagnosis Date  . Hypertension   . Dementia   . Hydrocephalus with operating shunt   . Neuropathy   . AVNRT (AV nodal re-entry tachycardia)   . Falls   . CKD (chronic kidney disease), stage III   . Normal pressure hydrocephalus 12/24/2012  . Abnormality of gait 12/24/2012  . Dyslipidemia   . Hypothyroidism   . Personal history of subdural hematoma     Bilateral, following VP shunt placement  . Migraine headache   . Cancer     Prostate cancer  . Memory loss     Past Surgical History  Procedure Laterality Date  . Carotid artery angioplasty    . Cystostomy w/ bladder biopsy    . Ventriculoperitoneal shunt      PROGRAMMABLE  . Cataract extraction Bilateral   . Shoulder surgery Left     Bone spur  . Carotid endarterectomy      Right    Family History  Problem Relation Age of Onset  . Stroke Mother   . Diabetes Father     Social History:  reports that he has been smoking.  He has never used smokeless tobacco. He reports that he does not drink alcohol or use illicit drugs.  Allergies:  Allergies  Allergen Reactions  . Asa [Aspirin]   . Erythromycin   . Morphine And Related   . Oxycodone   . Stadol [Butorphanol]   . Tylox [Oxycodone-Acetaminophen]   . Versed [Midazolam]     Medications: I have reviewed the patient's current medications.  Results for orders placed during the hospital encounter of 07/31/13 (from the past 48 hour(s))  CBC WITH DIFFERENTIAL     Status: Abnormal   Collection Time    07/31/13 10:00 AM      Result Value Ref Range    WBC 14.1 (*) 4.0 - 10.5 K/uL   RBC 3.71 (*) 4.22 - 5.81 MIL/uL   Hemoglobin 11.4 (*) 13.0 - 17.0 g/dL   HCT 32.7 (*) 39.0 - 52.0 %   MCV 88.1  78.0 - 100.0 fL   MCH 30.7  26.0 - 34.0 pg   MCHC 34.9  30.0 - 36.0 g/dL   RDW 12.2  11.5 - 15.5 %   Platelets 304  150 - 400 K/uL   Neutrophils Relative % 88 (*) 43 - 77 %   Neutro Abs 12.4 (*) 1.7 - 7.7 K/uL   Lymphocytes Relative 6 (*) 12 - 46 %   Lymphs Abs 0.8  0.7 - 4.0 K/uL   Monocytes Relative 6  3 - 12 %   Monocytes Absolute 0.8  0.1 - 1.0 K/uL   Eosinophils Relative 0  0 - 5 %   Eosinophils Absolute 0.1  0.0 - 0.7 K/uL   Basophils Relative 0  0 - 1 %   Basophils Absolute 0.0  0.0 - 0.1 K/uL  COMPREHENSIVE METABOLIC PANEL     Status: Abnormal   Collection Time    07/31/13 10:00  AM      Result Value Ref Range   Sodium 137  137 - 147 mEq/L   Potassium 3.8  3.7 - 5.3 mEq/L   Chloride 99  96 - 112 mEq/L   CO2 23  19 - 32 mEq/L   Glucose, Bld 164 (*) 70 - 99 mg/dL   BUN 28 (*) 6 - 23 mg/dL   Creatinine, Ser 1.83 (*) 0.50 - 1.35 mg/dL   Calcium 9.0  8.4 - 10.5 mg/dL   Total Protein 7.0  6.0 - 8.3 g/dL   Albumin 3.4 (*) 3.5 - 5.2 g/dL   AST 15  0 - 37 U/L   ALT 10  0 - 53 U/L   Alkaline Phosphatase 74  39 - 117 U/L   Total Bilirubin 0.4  0.3 - 1.2 mg/dL   GFR calc non Af Amer 34 (*) >90 mL/min   GFR calc Af Amer 40 (*) >90 mL/min   Comment: (NOTE)     The eGFR has been calculated using the CKD EPI equation.     This calculation has not been validated in all clinical situations.     eGFR's persistently <90 mL/min signify possible Chronic Kidney     Disease.   Anion gap 15  5 - 15  APTT     Status: None   Collection Time    07/31/13 10:00 AM      Result Value Ref Range   aPTT 28  24 - 37 seconds  PROTIME-INR     Status: None   Collection Time    07/31/13 10:00 AM      Result Value Ref Range   Prothrombin Time 13.9  11.6 - 15.2 seconds   INR 1.07  0.00 - 1.49  TYPE AND SCREEN     Status: None   Collection Time     07/31/13 10:00 AM      Result Value Ref Range   ABO/RH(D) O POS     Antibody Screen NEG     Sample Expiration 08/03/2013    ABO/RH     Status: None   Collection Time    07/31/13 10:00 AM      Result Value Ref Range   ABO/RH(D) O POS    URINALYSIS, ROUTINE W REFLEX MICROSCOPIC     Status: Abnormal   Collection Time    07/31/13 11:01 AM      Result Value Ref Range   Color, Urine YELLOW  YELLOW   APPearance CLEAR  CLEAR   Specific Gravity, Urine 1.022  1.005 - 1.030   pH 5.5  5.0 - 8.0   Glucose, UA NEGATIVE  NEGATIVE mg/dL   Hgb urine dipstick NEGATIVE  NEGATIVE   Bilirubin Urine NEGATIVE  NEGATIVE   Ketones, ur NEGATIVE  NEGATIVE mg/dL   Protein, ur 30 (*) NEGATIVE mg/dL   Urobilinogen, UA 0.2  0.0 - 1.0 mg/dL   Nitrite NEGATIVE  NEGATIVE   Leukocytes, UA TRACE (*) NEGATIVE  URINE MICROSCOPIC-ADD ON     Status: None   Collection Time    07/31/13 11:01 AM      Result Value Ref Range   Squamous Epithelial / LPF RARE  RARE   WBC, UA 3-6  <3 WBC/hpf   RBC / HPF 0-2  <3 RBC/hpf   Urine-Other MUCOUS PRESENT      Dg Chest 1 View  07/31/2013   CLINICAL DATA:  fall, hip pain  EXAM: CHEST - 1 VIEW  COMPARISON:  07/10/2012  FINDINGS: VP shunt tubing partially seen over the right hemi thorax. Lungs clear. The mild edema suspected on previous study has resolved. Heart size normal. No effusion. Regional bones unremarkable.  IMPRESSION: 1. No acute disease   Electronically Signed   By: Arne Cleveland M.D.   On: 07/31/2013 09:42   Dg Hip Complete Right  07/31/2013   CLINICAL DATA:  HIP PAIN FALL  EXAM: RIGHT HIP - COMPLETE 2+ VIEW  COMPARISON:  07/21/2003  FINDINGS: There is a comminuted intertrochanteric fracture of the right femur. There are several cm displacement and foreshortening of major fracture fragments. The lesser trochanter is a separate fracture fragment. Femoral head remains located.  IMPRESSION: 1. Comminuted displaced right intertrochanteric femur fracture.   Electronically  Signed   By: Arne Cleveland M.D.   On: 07/31/2013 09:40   Dg Femur Right  07/31/2013   CLINICAL DATA:  HIP PAIN FALL  EXAM: RIGHT FEMUR - 2 VIEW  COMPARISON:  07/21/2003  FINDINGS: Comminuted intertrochanteric fracture of the right femur. Several cm of displacement and override of major fracture fragments. The lesser trochanter is a separate fracture fragment. The femoral head remains seated. Distal femur is intact. Patchy femoral-popliteal arterial calcifications.  IMPRESSION: 1. Comminuted displaced intertrochanteric fracture   Electronically Signed   By: Arne Cleveland M.D.   On: 07/31/2013 09:41   Ct Head Wo Contrast  07/31/2013   CLINICAL DATA:  Fall ; dementia  EXAM: CT HEAD WITHOUT CONTRAST  CT CERVICAL SPINE WITHOUT CONTRAST  COMPARISON:  Brain CT July 04, 2013  TECHNIQUE: Multidetector CT imaging of the head and cervical spine was performed following the standard protocol without intravenous contrast. Multiplanar CT image reconstructions of the cervical spine were also generated.  FINDINGS: CT HEAD: There is diffuse ventricular enlargement with less pronounced sulcal enlargement, stable. There is a shunt catheter with the tip in the right lateral ventricle, stable. There is no intracranial mass, hemorrhage, extra-axial fluid collection or midline shift. There is decreased attenuation surrounding the lateral ventricles, stable. There are small lacunar type infarcts in the lateral left lentiform nucleus as well as in both external capsules, stable. No new gray-white compartment lesion. No acute infarct.  Bony calvarium appears intact except for a shunt catheter placement defect in the anterior right parietal bone. Mastoid air cells are clear. There is ethmoid sinus disease bilaterally.  CT CERVICAL SPINE: There is no fracture or spondylolisthesis. Prevertebral soft tissues and predental space regions are normal. There is moderate disc space narrowing at C4-5, C5-6, and C6-7. There is bony hypertrophy  at most levels with multiple areas of as exit foraminal narrowing due to facet hypertrophy. No disc extrusion or stenosis. There is calcification in both carotid arteries.  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 Stable ventricular enlargement with shunt catheter in place, stable from prior study. Decreased attenuation adjacent to the ventricles is most likely due to a combination of small vessel disease and interstitial edema from the chronic ventricular enlargement. No acute infarct apparent. No hemorrhage or mass effect. There is ethmoid sinus disease bilaterally.  CT cervical spine: Multilevel spondylosis and osteoarthritic change. No fracture or spondylolisthesis. Calcification in both carotid arteries.   Electronically Signed   By: Lowella Grip M.D.   On: 07/31/2013 10:02   Ct Cervical Spine Wo Contrast  07/31/2013   CLINICAL DATA:  Fall ; dementia  EXAM: CT HEAD WITHOUT CONTRAST  CT CERVICAL SPINE WITHOUT CONTRAST  COMPARISON:  Brain CT July 04, 2013  TECHNIQUE: Multidetector CT imaging of the head and cervical spine was performed following the standard protocol without intravenous contrast. Multiplanar CT image reconstructions of the cervical spine were also generated.  FINDINGS: CT HEAD: There is diffuse ventricular enlargement with less pronounced sulcal enlargement, stable. There is a shunt catheter with the tip in the right lateral ventricle, stable. There is no intracranial mass, hemorrhage, extra-axial fluid collection or midline shift. There is decreased attenuation surrounding the lateral ventricles, stable. There are small lacunar type infarcts in the lateral left lentiform nucleus as well as in both external capsules, stable. No new gray-white compartment lesion. No acute infarct.  Bony calvarium appears intact except for a shunt catheter placement defect in the anterior right parietal bone. Mastoid air cells are clear. There is ethmoid  sinus disease bilaterally.  CT CERVICAL SPINE: There is no fracture or spondylolisthesis. Prevertebral soft tissues and predental space regions are normal. There is moderate disc space narrowing at C4-5, C5-6, and C6-7. There is bony hypertrophy at most levels with multiple areas of as exit foraminal narrowing due to facet hypertrophy. No disc extrusion or stenosis. There is calcification in both carotid arteries.  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 Stable ventricular enlargement with shunt catheter in place, stable from prior study. Decreased attenuation adjacent to the ventricles is most likely due to a combination of small vessel disease and interstitial edema from the chronic ventricular enlargement. No acute infarct apparent. No hemorrhage or mass effect. There is ethmoid sinus disease bilaterally.  CT cervical spine: Multilevel spondylosis and osteoarthritic change. No fracture or spondylolisthesis. Calcification in both carotid arteries.   Electronically Signed   By: Lowella Grip M.D.   On: 08-15-2013 10:02    ROS:  No recent f/c/n/v/wt loss.  Frequent falls. PE:  Blood pressure 102/63, pulse 111, resp. rate 17, SpO2 96.00%. Elderly male intermittently in distress.  Alert at times and somnolent otherwise.  Not oriented.  EOMI.  resp unlabored.  Mood is normal.  Affect is flat.  Gait is nwb on R LE.  R LE flexed and shortened.  Skin healthy and intact.  1+ dp and pt pulses.  Feels LT throughout LEs.  No lymphadenopathy.    Assessment/Plan: R hip intertroch fracture - I explained the nature of the injury to the patient's family in detail.  We discussed the risks and benefits of surgical and non-surgical treatment options in detail.  The family has decided that they would like to proceed with surgery.  We'll transfer him over to [facility] for surgery later today.  They understand the plan and agree.  They specifically understand risks  of bleeding, infection, nerve damage, blood clots, need for additional surgery, amputation and death.   [name] 2013-08-15, 1:48 PM

## 2013-07-31 NOTE — Progress Notes (Signed)
Patient pulls out IV. Pulls off telemetry leads. Pulls off pulse oximeter monitor

## 2013-07-31 NOTE — ED Notes (Signed)
Bed: WA04 Expected date:  Expected time:  Means of arrival:  Comments: 

## 2013-07-31 NOTE — Progress Notes (Signed)
Wife states she is power of attorney for medical care and direct conversation of risks and benefits associated with the use of a continuious pulses oximeter. [place] has been imformed and states he will intervene for patients benefits if needed.

## 2013-07-31 NOTE — ED Notes (Signed)
Due to pt's agitation upon arousing and restless due to pain, will hold on placing foley at this time per Tomi Bamberger, MD. Family aware and agrees with this plan. Pt much calmer after ativan administration, however still restless and moving a lot in bed, with R leg bent under L leg or laying on R side. No longer trying to climb out of bed. Family at bedside. CT C-spine and head negative, C-collar removed by RN per Tomi Bamberger, MD. Will continue to monitor.

## 2013-07-31 NOTE — ED Provider Notes (Signed)
CSN: [id number]     Arrival date & time 07/31/13  9417 History   First MD Initiated Contact with Patient 07/31/13 [phone]     Chief Complaint  Patient presents with  . Hip Pain  . Fall   Level V caveat for dementia  (Consider location/radiation/quality/duration/timing/severity/associated sxs/prior Treatment) HPI Patient presents to the emergency department via EMS from his nursing facility. He was found on the floor in his room. It was not determined how long he had been on the floor. However he has pain in his right hip area. Patient was stabilized by EMS and transported to the ED. He has received fentanyl 150 mcg in route. When I asked the patient if his head hurts he states no, when I ask him if his neck hurts he states no. When I ask him if his leg hurts he states no.  PCP Dr Arville Care  Past Medical History  Diagnosis Date  . Hypertension   . Dementia   . Hydrocephalus with operating shunt   . Neuropathy   . AVNRT (AV nodal re-entry tachycardia)   . Falls   . CKD (chronic kidney disease), stage III   . Normal pressure hydrocephalus 12/24/2012  . Abnormality of gait 12/24/2012  . Dyslipidemia   . Hypothyroidism   . Personal history of subdural hematoma     Bilateral, following VP shunt placement  . Migraine headache   . Cancer     Prostate cancer  . Memory loss    Past Surgical History  Procedure Laterality Date  . Carotid artery angioplasty    . Cystostomy w/ bladder biopsy    . Ventriculoperitoneal shunt      PROGRAMMABLE  . Cataract extraction Bilateral   . Shoulder surgery Left     Bone spur  . Carotid endarterectomy      Right   Family History  Problem Relation Age of Onset  . Stroke Mother   . Diabetes Father    History  Substance Use Topics  . Smoking status: Current Every Day Smoker -- 0.50 packs/day  . Smokeless tobacco: Never Used  . Alcohol Use: No  lives in a NH  Review of Systems  Unable to perform ROS: Dementia      Allergies  Asa;  Erythromycin; Morphine and related; Oxycodone; Stadol; Tylox; and Versed  Home Medications   Prior to Admission medications   Medication Sig Start Date End Date Taking? Authorizing Provider  amitriptyline (ELAVIL) 25 MG tablet Take 25 mg by mouth at bedtime.   Yes Historical Provider, MD  CALCIUM-VITAMIN D PO Take 1 tablet by mouth daily.    Yes Historical Provider, MD  diltiazem (CARDIZEM CD) 240 MG 24 hr capsule Take 1 capsule (240 mg total) by mouth daily. 07/13/12  Yes Debbe Odea, MD  donepezil (ARICEPT) 10 MG tablet Take 10 mg by mouth at bedtime.   Yes Historical Provider, MD  fish oil-omega-3 fatty acids 1000 MG capsule Take 1 g by mouth 3 (three) times daily.    Yes Historical Provider, MD  levothyroxine (SYNTHROID, LEVOTHROID) 150 MCG tablet Take 150 mcg by mouth daily before breakfast.   Yes Historical Provider, MD  Multiple Vitamin (MULTIVITAMIN WITH MINERALS) TABS tablet Take 1 tablet by mouth daily.   Yes Historical Provider, MD  omeprazole (PRILOSEC) 20 MG capsule Take 20 mg by mouth 2 (two) times daily.   Yes Historical Provider, MD  QUEtiapine (SEROQUEL) 25 MG tablet Take 1 tablet (25 mg total) by mouth at bedtime. 07/13/12  Yes Debbe Odea, MD  risperiDONE (RISPERDAL) 0.25 MG tablet Take 1 tablet (0.25 mg total) by mouth 2 (two) times daily as needed (for agitation). 07/13/12  Yes Debbe Odea, MD  sertraline (ZOLOFT) 100 MG tablet Take 100 mg by mouth daily.   Yes Historical Provider, MD  thiamine (VITAMIN B-1) 100 MG tablet Take 300 mg by mouth daily.   Yes Historical Provider, MD  vitamin E 400 UNIT capsule Take 400 Units by mouth 2 (two) times daily.   Yes Historical Provider, MD   BP 160/81  Pulse 83  Resp 18  SpO2 90%  Vital signs normal   Physical Exam  Nursing note and vitals reviewed. Constitutional: He appears well-developed and well-nourished.  Non-toxic appearance. He does not appear ill. No distress.  Pt is KED with C collar in place, the KED was removed by  myself and nursing staff  HENT:  Head: Normocephalic and atraumatic.  Right Ear: External ear normal.  Left Ear: External ear normal.  Nose: Nose normal. No mucosal edema or rhinorrhea.  Mouth/Throat: Oropharynx is clear and moist and mucous membranes are normal. No dental abscesses or uvula swelling.  No obvious bruising to his head.   Eyes: Conjunctivae and EOM are normal. Pupils are equal, round, and reactive to light.  Neck: Full passive range of motion without pain.  ccollar in place  Cardiovascular: Normal rate, regular rhythm and normal heart sounds.  Exam reveals no gallop and no friction rub.   No murmur heard. Pulmonary/Chest: Effort normal and breath sounds normal. No respiratory distress. He has no wheezes. He has no rhonchi. He has no rales. He exhibits no tenderness and no crepitus.  Abdominal: Soft. Normal appearance and bowel sounds are normal. He exhibits no distension. There is no tenderness. There is no rebound and no guarding.  Musculoskeletal: Normal range of motion. He exhibits no edema and no tenderness.  Pt has had his right hip stabilized by EMS with a sheet with a pillow under his right thigh. He has no ext rotation, ? Shortening. Pt grabs his leg when we removed the KED as if it were painful.   Neurological: He is alert. He has normal strength. No cranial nerve deficit.  Skin: Skin is warm, dry and intact. No rash noted. No erythema. No pallor.  Psychiatric: He has a normal mood and affect. His speech is normal and behavior is normal. His mood appears not anxious.    ED Course  Procedures (including critical care time)  Medications  haloperidol lactate (HALDOL) injection 2 mg (0 mg Intravenous Hold 07/31/13 1131)  fentaNYL (SUBLIMAZE) injection 50 mcg (not administered)  fentaNYL (SUBLIMAZE) injection 50 mcg (50 mcg Intravenous Given 07/31/13 0855)  LORazepam (ATIVAN) injection 1 mg (1 mg Intravenous Given 07/31/13 0907)    09:05 Nurses report pt is getting  combative and not cooperative to get his xrays. Ativan ordered.  10:55 Son and wife here, made aware of fracture and need for surgery. Pt usually only pedals in a wheelchair and walks with difficulty with a walker.   11:25 Dr Doran Durand, wants patient to have surgery and be admitted at [facility], wants to take to the OR today.   12:42 I called the Flow Manager because the hospitalist has not responded.   12:48 Dr Daleen Bo, admit to [facility], tele, team 10, use him as accepting physcian.   Labs Review Results for orders placed during the hospital encounter of 07/31/13  CBC WITH DIFFERENTIAL      Result Value  Ref Range   WBC 14.1 (*) 4.0 - 10.5 K/uL   RBC 3.71 (*) 4.22 - 5.81 MIL/uL   Hemoglobin 11.4 (*) 13.0 - 17.0 g/dL   HCT 32.7 (*) 39.0 - 52.0 %   MCV 88.1  78.0 - 100.0 fL   MCH 30.7  26.0 - 34.0 pg   MCHC 34.9  30.0 - 36.0 g/dL   RDW 12.2  11.5 - 15.5 %   Platelets 304  150 - 400 K/uL   Neutrophils Relative % 88 (*) 43 - 77 %   Neutro Abs 12.4 (*) 1.7 - 7.7 K/uL   Lymphocytes Relative 6 (*) 12 - 46 %   Lymphs Abs 0.8  0.7 - 4.0 K/uL   Monocytes Relative 6  3 - 12 %   Monocytes Absolute 0.8  0.1 - 1.0 K/uL   Eosinophils Relative 0  0 - 5 %   Eosinophils Absolute 0.1  0.0 - 0.7 K/uL   Basophils Relative 0  0 - 1 %   Basophils Absolute 0.0  0.0 - 0.1 K/uL  COMPREHENSIVE METABOLIC PANEL      Result Value Ref Range   Sodium 137  137 - 147 mEq/L   Potassium 3.8  3.7 - 5.3 mEq/L   Chloride 99  96 - 112 mEq/L   CO2 23  19 - 32 mEq/L   Glucose, Bld 164 (*) 70 - 99 mg/dL   BUN 28 (*) 6 - 23 mg/dL   Creatinine, Ser 1.83 (*) 0.50 - 1.35 mg/dL   Calcium 9.0  8.4 - 10.5 mg/dL   Total Protein 7.0  6.0 - 8.3 g/dL   Albumin 3.4 (*) 3.5 - 5.2 g/dL   AST 15  0 - 37 U/L   ALT 10  0 - 53 U/L   Alkaline Phosphatase 74  39 - 117 U/L   Total Bilirubin 0.4  0.3 - 1.2 mg/dL   GFR calc non Af Amer 34 (*) >90 mL/min   GFR calc Af Amer 40 (*) >90 mL/min   Anion gap 15  5 - 15  APTT      Result Value Ref  Range   aPTT 28  24 - 37 seconds  PROTIME-INR      Result Value Ref Range   Prothrombin Time 13.9  11.6 - 15.2 seconds   INR 1.07  0.00 - 1.49  URINALYSIS, ROUTINE W REFLEX MICROSCOPIC      Result Value Ref Range   Color, Urine YELLOW  YELLOW   APPearance CLEAR  CLEAR   Specific Gravity, Urine 1.022  1.005 - 1.030   pH 5.5  5.0 - 8.0   Glucose, UA NEGATIVE  NEGATIVE mg/dL   Hgb urine dipstick NEGATIVE  NEGATIVE   Bilirubin Urine NEGATIVE  NEGATIVE   Ketones, ur NEGATIVE  NEGATIVE mg/dL   Protein, ur 30 (*) NEGATIVE mg/dL   Urobilinogen, UA 0.2  0.0 - 1.0 mg/dL   Nitrite NEGATIVE  NEGATIVE   Leukocytes, UA TRACE (*) NEGATIVE  URINE MICROSCOPIC-ADD ON      Result Value Ref Range   Squamous Epithelial / LPF RARE  RARE   WBC, UA 3-6  <3 WBC/hpf   RBC / HPF 0-2  <3 RBC/hpf   Urine-Other MUCOUS PRESENT    TYPE AND SCREEN      Result Value Ref Range   ABO/RH(D) O POS     Antibody Screen NEG     Sample Expiration 08/03/2013    ABO/RH  Result Value Ref Range   ABO/RH(D) O POS      Laboratory interpretation all normal except stable renal insufficieny, mild anemia, leukocytosis     Imaging Review Dg Chest 1 View  07/31/2013   CLINICAL DATA:  fall, hip pain  EXAM: CHEST - 1 VIEW  COMPARISON:  07/10/2012  FINDINGS: VP shunt tubing partially seen over the right hemi thorax. Lungs clear. The mild edema suspected on previous study has resolved. Heart size normal. No effusion. Regional bones unremarkable.  IMPRESSION: 1. No acute disease   Electronically Signed   By: Arne Cleveland M.D.   On: 07/31/2013 09:42   Dg Hip Complete Right  07/31/2013   CLINICAL DATA:  HIP PAIN FALL  EXAM: RIGHT HIP - COMPLETE 2+ VIEW  COMPARISON:  07/21/2003  FINDINGS: There is a comminuted intertrochanteric fracture of the right femur. There are several cm displacement and foreshortening of major fracture fragments. The lesser trochanter is a separate fracture fragment. Femoral head remains located.   IMPRESSION: 1. Comminuted displaced right intertrochanteric femur fracture.   Electronically Signed   By: Arne Cleveland M.D.   On: 07/31/2013 09:40   Dg Femur Right  07/31/2013   CLINICAL DATA:  HIP PAIN FALL  EXAM: RIGHT FEMUR - 2 VIEW  COMPARISON:  07/21/2003  FINDINGS: Comminuted intertrochanteric fracture of the right femur. Several cm of displacement and override of major fracture fragments. The lesser trochanter is a separate fracture fragment. The femoral head remains seated. Distal femur is intact. Patchy femoral-popliteal arterial calcifications.  IMPRESSION: 1. Comminuted displaced intertrochanteric fracture   Electronically Signed   By: Arne Cleveland M.D.   On: 07/31/2013 09:41   Ct Head Wo Contrast  Ct Cervical Spine Wo Contrast  07/31/2013   CLINICAL DATA:  Fall ; dementia  EXAM: CT HEAD WITHOUT CONTRAST  CT CERVICAL SPINE WITHOUT CONTRAST  COMPARISON:  Brain CT July 04, 2013  TECHNIQUE: Multidetector CT imaging of the head and cervical spine was performed following the standard protocol without intravenous contrast. Multiplanar CT image reconstructions of the cervical spine were also generated.  FINDINGS: CT HEAD: There is diffuse ventricular enlargement with less pronounced sulcal enlargement, stable. There is a shunt catheter with the tip in the right lateral ventricle, stable. There is no intracranial mass, hemorrhage, extra-axial fluid collection or midline shift. There is decreased attenuation surrounding the lateral ventricles, stable. There are small lacunar type infarcts in the lateral left lentiform nucleus as well as in both external capsules, stable. No new gray-white compartment lesion. No acute infarct.  Bony calvarium appears intact except for a shunt catheter placement defect in the anterior right parietal bone. Mastoid air cells are clear. There is ethmoid sinus disease bilaterally.  CT CERVICAL SPINE: There is no fracture or spondylolisthesis. Prevertebral soft tissues and  predental space regions are normal. There is moderate disc space narrowing at C4-5, C5-6, and C6-7. There is bony hypertrophy at most levels with multiple areas of as exit foraminal narrowing due to facet hypertrophy. No disc extrusion or stenosis. There is calcification in both carotid arteries.  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 Stable ventricular enlargement with shunt catheter in place, stable from prior study. Decreased attenuation adjacent to the ventricles is most likely due to a combination of small vessel disease and interstitial edema from the chronic ventricular enlargement. No acute infarct apparent. No hemorrhage or mass effect. There is ethmoid sinus disease bilaterally.  CT cervical spine: Multilevel spondylosis and osteoarthritic change.  No fracture or spondylolisthesis. Calcification in both carotid arteries.   Electronically Signed   By: Lowella Grip M.D.   On: 07/31/2013 10:02    Ct Head Wo Contrast  07/05/2013   [facility]  NEUROIMAGING REPORT   STUDY DATE: 07/04/13 PATIENT NAME: Tradarius Reinwald [place] DOB: Sep 05, 1934 MRN: [id number]  ORDERING CLINICIAN: Kathrynn Ducking, MD  CLINICAL HISTORY: 79 year old male with NPH.  EXAM: CT head (without)  TECHNIQUE: CT scan of the head was obtained utilizing 5 mm axial slices  from the skull base to the vertex. CONTRAST: no IMAGING SITE: [facility]   FINDINGS:  The cortical sulci, fissures and cisterns are normal in size and  appearance.  Lateral, third and fourth ventricle are notable for moderate  to severe ventriculomegaly. There is a right frontal intra-ventricular  shunt catheter. No extra-axial fluid collections are seen.  No  intracranial hemorrhage.  No evidence of mass effect or midline shift.   There is moderate periventricular and subcortical chronic small vessel  ischemic disease vs transependymal edema.    The orbits and their contents,  paranasal sinuses and calvarium are  unremarkable.      07/05/2013   Abnormal CT head (without) demonstrating: 1. Moderate to severe ventriculomegaly.  2. Moderate periventricular and subcortical chronic small vessel ischemic  disease vs transependymal edema.  3. No acute findings. No significant change from CT on 07/07/12.   INTERPRETING PHYSICIAN:  Penni Bombard, MD Certified in Neurology, Neurophysiology and Neuroimaging  [facility] [address] [place], [place] [address] [phone]    EKG Interpretation   Date/Time:  Sunday July 31 2013 10:47:34 EDT Ventricular Rate:  100 PR Interval:  163 QRS Duration: 86 QT Interval:  393 QTC Calculation: 507 R Axis:   -34 Text Interpretation:  Sinus tachycardia Multiple premature complexes, vent   Left axis deviation Abnormal R-wave progression, early transition  Prolonged QT interval Baseline wander in lead(s) V3 Since last tracing 15 Jul 2012 Premature ventricular complexes are now Present Confirmed by  Mariselda Badalamenti  MD-I, Sheray Grist ([id number]) on 07/31/2013 12:11:33 PM      MDM   Final diagnoses:  Dementia, with behavioral disturbance  Intertrochanteric fracture of right femur, closed, initial encounter  Renal insufficiency  Fall at nursing home, initial encounter    Plan transfer to [facility] for surgery and admission   Rolland Porter, MD, Alanson Aly, MD 07/31/13 1252

## 2013-07-31 NOTE — Progress Notes (Signed)
Patient has multiple family members in room at present. Family states continuous pulse oximeter is too disturbing to them and must be turned off. Post surgical risk with poor oxygenation explained to family without success. Family still demands continuous pulse oximeter to be removed. Continuous pulse ox in place with nasal cannula d/t o2 sats 86-88 at baseline.

## 2013-07-31 NOTE — Progress Notes (Signed)
DR Broadus John paged regarding pain and or agitation as demonstrated by; refuses to leave on o2, continuous pulse ox, and pulling out IV.

## 2013-07-31 NOTE — Transfer of Care (Signed)
Immediate Anesthesia Transfer of Care Note  Patient: Walter Carey  Procedure(s) Performed: Procedure(s): INTRAMEDULLARY (IM) NAIL INTERTROCHANTRIC (Right)  Patient Location: PACU  Anesthesia Type:General  Level of Consciousness: sedated, confused and responds to stimulation  Airway & Oxygen Therapy: Patient Spontanous Breathing and Patient connected to face mask oxygen  Post-op Assessment: Report given to PACU RN and Post -op Vital signs reviewed and stable  Post vital signs: Reviewed and stable  Complications: No apparent anesthesia complications

## 2013-07-31 NOTE — Anesthesia Procedure Notes (Signed)
Procedure Name: Intubation Date/Time: 07/31/2013 3:36 PM Performed by: Jacquiline Doe A Pre-anesthesia Checklist: Patient identified, Timeout performed, Emergency Drugs available, Suction available and Patient being monitored Patient Re-evaluated:Patient Re-evaluated prior to inductionOxygen Delivery Method: Circle system utilized Preoxygenation: Pre-oxygenation with 100% oxygen Intubation Type: IV induction, Rapid sequence and Cricoid Pressure applied Laryngoscope Size: Mac and 4 Grade View: Grade I Tube type: Oral Tube size: 8.0 mm Number of attempts: 1 Airway Equipment and Method: Stylet Placement Confirmation: ETT inserted through vocal cords under direct vision,  breath sounds checked- equal and bilateral and positive ETCO2 Secured at: 23 cm Tube secured with: Tape Dental Injury: Teeth and Oropharynx as per pre-operative assessment  Comments: Dentures removed during induction . No trauma . SaO2 100% throughout .

## 2013-07-31 NOTE — Anesthesia Postprocedure Evaluation (Signed)
Anesthesia Post Note  Patient: Walter Carey  Procedure(s) Performed: Procedure(s) (LRB): INTRAMEDULLARY (IM) NAIL INTERTROCHANTRIC (Right)  Anesthesia type: general  Patient location: PACU  Post pain: Pain level controlled  Post assessment: Patient's Cardiovascular Status Stable  Last Vitals:  Filed Vitals:   07/31/13 1813  BP: 143/95  Pulse: 87  Temp: 36.5 C  Resp: 18    Post vital signs: Reviewed and stable  Level of consciousness: sedated  Complications: No apparent anesthesia complications

## 2013-07-31 NOTE — ED Notes (Signed)
Bed: WA06 Expected date:  Expected time:  Means of arrival:  Comments: Hip dislocation

## 2013-08-01 DIAGNOSIS — N183 Chronic kidney disease, stage 3 unspecified: Secondary | ICD-10-CM

## 2013-08-01 DIAGNOSIS — Z515 Encounter for palliative care: Secondary | ICD-10-CM

## 2013-08-01 DIAGNOSIS — IMO0002 Reserved for concepts with insufficient information to code with codable children: Secondary | ICD-10-CM

## 2013-08-01 LAB — COMPREHENSIVE METABOLIC PANEL
ALBUMIN: 3.1 g/dL — AB (ref 3.5–5.2)
ALT: 17 U/L (ref 0–53)
AST: 73 U/L — ABNORMAL HIGH (ref 0–37)
Alkaline Phosphatase: 60 U/L (ref 39–117)
Anion gap: 16 — ABNORMAL HIGH (ref 5–15)
BUN: 40 mg/dL — ABNORMAL HIGH (ref 6–23)
CALCIUM: 8.8 mg/dL (ref 8.4–10.5)
CHLORIDE: 98 meq/L (ref 96–112)
CO2: 23 meq/L (ref 19–32)
CREATININE: 2.05 mg/dL — AB (ref 0.50–1.35)
GFR calc Af Amer: 35 mL/min — ABNORMAL LOW (ref >90)
GFR calc non Af Amer: 30 mL/min — ABNORMAL LOW (ref >90)
Glucose, Bld: 115 mg/dL — ABNORMAL HIGH (ref 70–99)
Potassium: 4.5 mEq/L (ref 3.7–5.3)
Sodium: 137 mEq/L (ref 137–147)
Total Bilirubin: 0.6 mg/dL (ref 0.3–1.2)
Total Protein: 6.5 g/dL (ref 6.0–8.3)

## 2013-08-01 LAB — PROTIME-INR
INR: 1.24 (ref 0.00–1.49)
PROTHROMBIN TIME: 15.6 s — AB (ref 11.6–15.2)

## 2013-08-01 LAB — CBC
HEMATOCRIT: 25.6 % — AB (ref 39.0–52.0)
HEMOGLOBIN: 8.5 g/dL — AB (ref 13.0–17.0)
MCH: 30.5 pg (ref 26.0–34.0)
MCHC: 33.2 g/dL (ref 30.0–36.0)
MCV: 91.8 fL (ref 78.0–100.0)
Platelets: 257 10*3/uL (ref 150–400)
RBC: 2.79 MIL/uL — AB (ref 4.22–5.81)
RDW: 12.7 % (ref 11.5–15.5)
WBC: 13.3 10*3/uL — ABNORMAL HIGH (ref 4.0–10.5)

## 2013-08-01 MED ORDER — RISPERIDONE 0.5 MG PO TABS
0.5000 mg | ORAL_TABLET | Freq: Two times a day (BID) | ORAL | Status: DC | PRN
  Administered 2013-08-03 – 2013-08-07 (×6): 0.5 mg via ORAL
  Filled 2013-08-01 (×8): qty 1

## 2013-08-01 MED ORDER — HALOPERIDOL LACTATE 5 MG/ML IJ SOLN
1.0000 mg | Freq: Four times a day (QID) | INTRAMUSCULAR | Status: DC | PRN
  Administered 2013-08-01 – 2013-08-05 (×3): 1 mg via INTRAVENOUS
  Filled 2013-08-01 (×3): qty 1

## 2013-08-01 MED ORDER — LORAZEPAM 2 MG/ML IJ SOLN
INTRAMUSCULAR | Status: AC
  Administered 2013-08-01: 04:00:00
  Filled 2013-08-01: qty 1

## 2013-08-01 MED ORDER — LORAZEPAM 2 MG/ML IJ SOLN
1.0000 mg | Freq: Once | INTRAMUSCULAR | Status: AC
  Administered 2013-08-01: 1 mg via INTRAVENOUS

## 2013-08-01 NOTE — Consult Note (Signed)
Patient Walter Carey:LTYVD D Vollman      DOB: Feb 10, 1934      PBA:[id number]  Met with patient's daughter Richardean Chimera, and Coralyn Mark  Spouse present but deferring involvement .  She has had noted cognitive changes in the last few months and will be evaluated this week at memory center.  Family struggling with caring for two ailing parents.  Terisa and Coralyn Mark understand the challenges that their fathers behavioral disorder brings.  We talked about his decline and continued down spiral . They feel at this time that they understand that they [place] have to make decisions in the future to not continue curative care, but they don't want to feel like they are jumping the guan or pushing him towards the end of this life. Their biggest concern is getting the right location of care. We decided to continue current curative treatments.  I offered Palliative Medicine support at SNF  If he is able to transition.  We talked a little bit about Hospice in the future.    Recommend:  1. DNR  2. Pain: if patient restless consider Tylenol scheduled for a little while as he Therien not be able to express his discomfort. For now he looks comfortable. Family tells me he can get agitated with opiates.  3.  Agitation: agree with Risperdal, prn ativan and Zoloft  4.  See if patient has active SNF  Rehab  needs   I have asked Doris with SW to start working with family. They asked to fax him to [facility] as well as [facility].   Time:  330 pm- 400 pm Ravinder Hofland L. Lovena Le, MD MBA The Palliative Medicine Team at [facility] Phone: [phone] Pager: [phone] ( Use team phone after hours)

## 2013-08-01 NOTE — Care Management Note (Signed)
CARE MANAGEMENT NOTE 08/01/2013  Patient:  Walter Carey, Walter Carey   Account Number:  [id number]  Date Initiated:  08/01/2013  Documentation initiated by:  Ricki Miller  Subjective/Objective Assessment:   79 yr old male from [facility], s/p right hip fracture with IM Nailing.     Action/Plan:   Patient for shortterm rehab at [facility], Social worker is aware.   Anticipated DC Date:  08/03/2013   Anticipated DC Plan:  SKILLED NURSING FACILITY  In-house referral  Clinical Social Worker      DC Planning Services  CM consult      Choice offered to / List presented to:     DME arranged  NA        [facility] arranged  NA      Status of service:  In process, will continue to follow

## 2013-08-01 NOTE — Evaluation (Signed)
Clinical/Bedside Swallow Evaluation Patient Details  Name: Quadry Kampa Rabanal MRN: [id number] Date of Birth: 1934-10-01  Today's Date: 08/01/2013 Time: 1030-1115 SLP Time Calculation (min): 45 min  Past Medical History:  Past Medical History  Diagnosis Date  . Hypertension   . Dementia   . Hydrocephalus with operating shunt   . Neuropathy   . AVNRT (AV nodal re-entry tachycardia)   . Falls   . CKD (chronic kidney disease), stage III   . Normal pressure hydrocephalus 12/24/2012  . Abnormality of gait 12/24/2012  . Dyslipidemia   . Hypothyroidism   . Personal history of subdural hematoma     Bilateral, following VP shunt placement  . Migraine headache   . Cancer     Prostate cancer  . Memory loss    Past Surgical History:  Past Surgical History  Procedure Laterality Date  . Carotid artery angioplasty    . Cystostomy w/ bladder biopsy    . Ventriculoperitoneal shunt      PROGRAMMABLE  . Cataract extraction Bilateral   . Shoulder surgery Left     Bone spur  . Carotid endarterectomy      Right   HPI:  79 year old male admitted 07/31/13 due to fall and hip fracture. PMH significant for dementia. BSE ordered to evaluate swallow function and safety and identify least restrictive diet.   Assessment / Plan / Recommendation Clinical Impression  Pt required assist with oral care, then swallowed toothpaste/water rather than expectorate into basin. Oral motor structures appear adequate for speech and swallowing. Dentures were noted in pt room, but were not placed, due to pt agitation.  Pt was given thin liquids and puree consistency, without overt s/s aspiration or clinical indication of airway compromise. Pt refused solid consistency. RN present to adminster meds. Pt appeared to tolerate whole meds in puree, followed by liquid wash. Will begin puree diet with thin liquids and assess diet tolerance and appropriateness to advance to solid consistency.     Aspiration Risk  Mild    Diet  Recommendation Dysphagia 1 (Puree);Thin liquid   Liquid Administration via: Straw Medication Administration: Whole meds with puree Supervision: Staff to assist with self feeding;Full supervision/cueing for compensatory strategies Compensations: Slow rate;Small sips/bites;Follow solids with liquid Postural Changes and/or Swallow Maneuvers: Seated upright 90 degrees;Upright 30-60 min after meal    Other  Recommendations Oral Care Recommendations: Oral care BID   Follow Up Recommendations  Skilled Nursing facility;24 hour supervision/assistance    Frequency and Duration min 1 x/week  1 week   Pertinent Vitals/Pain VSS, 7 on PAINAD scale    SLP Swallow Goals  diet tolerance, adherence to swallow precautions.   Swallow Study Prior Functional Status   Unknown.    General HPI: 79 year old male admitted 07/31/13 due to fall and hip fracture. PMH significant for dementia. BSE ordered to evaluate swallow function and safety and identify least restrictive diet. Type of Study: Bedside swallow evaluation Previous Swallow Assessment: none found Diet Prior to this Study: Thin liquids Respiratory Status: Room air History of Recent Intubation: Yes Length of Intubations (days): 1 days (intubated for hip surgery) Date extubated: 08/01/13 Behavior/Cognition: Alert;Confused;Agitated;Requires cueing;Decreased sustained attention Oral Cavity - Dentition: Edentulous (dentures in room, but not placed) Self-Feeding Abilities: Total assist Patient Positioning: Upright in bed Baseline Vocal Quality: Clear Volitional Cough: Cognitively unable to elicit Volitional Swallow: Unable to elicit    Oral/Motor/Sensory Function Overall Oral Motor/Sensory Function: Appears within functional limits for tasks assessed   [employer]  chips: Not tested   Thin Liquid Thin Liquid: Within functional limits Presentation: Straw    Nectar Thick Nectar Thick Liquid: Not tested   Honey Thick Honey Thick Liquid: Not  tested   Puree Puree: Within functional limits Presentation: Spoon   Solid   GO    Solid: Not tested      Celia B. Quentin Ore [facility], [place] [phone]  Shonna Chock 08/01/2013,11:19 AM

## 2013-08-01 NOTE — Evaluation (Signed)
Physical Therapy Evaluation Patient Details Name: Walter Carey MRN: [id number] DOB: 07-14-1934 Today's Date: 08/01/2013   History of Present Illness  pt presents with R hip fx s/p IM nail.    Clinical Impression  Pt lethargic, but does arouse to name.  Pt stating he wanted to get up, but pain and cognition limiting ability to participate.  Attempted to reposition pt in bed as he has been laying on his R side, however pt becomes agitated with repositioning.  Will continue to follow.      Follow Up Recommendations SNF    Equipment Recommendations   (TBD)    Recommendations for Other Services       Precautions / Restrictions Precautions Precautions: Fall Restrictions Weight Bearing Restrictions: Yes RLE Weight Bearing: Weight bearing as tolerated      Mobility  Bed Mobility Overal bed mobility: Needs Assistance;+2 for physical assistance Bed Mobility: Supine to Sit;Sit to Supine     Supine to sit: Max assist;+2 for physical assistance;HOB elevated Sit to supine: Max assist;+2 for physical assistance;HOB elevated   General bed mobility comments: cues for participation and attending to task.    Transfers                    Ambulation/Gait                Stairs            Wheelchair Mobility    Modified Rankin (Stroke Patients Only)       Balance Overall balance assessment: Needs assistance Sitting-balance support: Bilateral upper extremity supported;Feet supported Sitting balance-Leahy Scale: Poor Sitting balance - Comments: pt with R lateral lean.                                       Pertinent Vitals/Pain Grimaces with all mobility.      Home Living Family/patient expects to be discharged to:: Skilled nursing facility                      Prior Function           Comments: pt with baseline dementia and unclear level of A needed at D/C.       Hand Dominance        Extremity/Trunk Assessment   Upper  Extremity Assessment: Defer to OT evaluation           Lower Extremity Assessment: Generalized weakness;RLE deficits/detail RLE Deficits / Details: Difficult to assess 2/2 cognition and agitation.      Cervical / Trunk Assessment: Kyphotic  Communication   Communication: No difficulties  Cognition Arousal/Alertness: Lethargic Behavior During Therapy: Agitated Overall Cognitive Status: Impaired/Different from baseline Area of Impairment: Orientation;Attention;Memory;Following commands;Safety/judgement;Awareness;Problem solving Orientation Level: Disoriented to;Place;Time;Situation Current Attention Level: Focused Memory: Decreased recall of precautions;Decreased short-term memory Following Commands: Follows one step commands inconsistently Safety/Judgement: Decreased awareness of safety;Decreased awareness of deficits Awareness: Intellectual Problem Solving: Slow processing;Decreased initiation;Difficulty sequencing;Requires verbal cues;Requires tactile cues General Comments: pt with baseline dementia and unclear baseline cognition, except that epr report pt was not combative at baseline.      General Comments      Exercises        Assessment/Plan    PT Assessment Patient needs continued PT services  PT Diagnosis Difficulty walking;Acute pain   PT Problem List Decreased strength;Decreased range of motion;Decreased activity tolerance;Decreased balance;Decreased mobility;Decreased coordination;Decreased cognition;Decreased  knowledge of use of DME;Decreased safety awareness;Pain  PT Treatment Interventions DME instruction;Gait training;Functional mobility training;Therapeutic activities;Therapeutic exercise;Balance training;Cognitive remediation;Patient/family education   PT Goals (Current goals can be found in the Care Plan section) Acute Rehab PT Goals PT Goal Formulation: Patient unable to participate in goal setting Time For Goal Achievement: 08/15/13 Potential to Achieve  Goals: Fair    Frequency Min 2X/week   Barriers to discharge        Co-evaluation PT/OT/SLP Co-Evaluation/Treatment: Yes Reason for Co-Treatment: Necessary to address cognition/behavior during functional activity;For patient/therapist safety PT goals addressed during session: Mobility/safety with mobility;Balance   SLP goals addressed during session: Swallowing     End of Session   Activity Tolerance: Treatment limited secondary to agitation;Patient limited by pain Patient left: in bed;with call bell/phone within reach;with nursing/sitter in room (RN stated will set bed alarm) Nurse Communication: Mobility status         Time: 1039-1105 PT Time Calculation (min): 26 min   Charges:   PT Evaluation $Initial PT Evaluation Tier I: 1 Procedure PT Treatments $Therapeutic Activity: 23-37 mins   PT G CodesCatarina Hartshorn, [place] [phone] 08/01/2013, 1:44 PM

## 2013-08-01 NOTE — Progress Notes (Signed)
Patient awakens trying to get out of bed. Pulling out iv's. Dr Broadus John paged- verbal order to repeat ativan again.

## 2013-08-01 NOTE — Progress Notes (Signed)
Subjective: 1 Day Post-Op Procedure(s) (LRB): INTRAMEDULLARY (IM) NAIL INTERTROCHANTRIC (Right) Patient was quite agitated and combative overngiht.  Ativan x 3.  Objective: Vital signs in last 24 hours: Temp:  [97.7 F (36.5 C)-99.3 F (37.4 C)] 99.3 F (37.4 C) (07/27 0627) Pulse Rate:  [87-122] 102 (07/27 0627) Resp:  [13-18] 18 (07/27 0400) BP: (111-167)/(72-110) 111/76 mmHg (07/27 0627) SpO2:  [90 %-99 %] 90 % (07/27 0627) Weight:  [76.9 kg (169 lb 8.5 oz)] 76.9 kg (169 lb 8.5 oz) (07/27 0555)  Intake/Output from previous day: 07/26 0701 - 07/27 0700 In: 1150 [I.V.:1150] Out: 702 [Urine:600; Taylor; Blood:100] Intake/Output this shift:     Recent Labs  07/31/13 1000 08/01/13 0650  HGB 11.4* 8.5*    Recent Labs  07/31/13 1000 08/01/13 0650  WBC 14.1* 13.3*  RBC 3.71* 2.79*  HCT 32.7* 25.6*  PLT 304 257    Recent Labs  07/31/13 1000 08/01/13 0650  NA 137 137  K 3.8 4.5  CL 99 98  CO2 23 23  BUN 28* 40*  CREATININE 1.83* 2.05*  GLUCOSE 164* 115*  CALCIUM 9.0 8.8    Recent Labs  07/31/13 1000 08/01/13 0650  INR 1.07 1.24    PE:  R LE with incisions dressed and dry.  No gross deformity.  Assessment/Plan: 1 Day Post-Op Procedure(s) (LRB): INTRAMEDULLARY (IM) NAIL INTERTROCHANTRIC (Right) Discharge to SNF when available.  PT for WBAT on R LE.  Peyson Delao 08/01/2013, 1:02 PM

## 2013-08-01 NOTE — Evaluation (Signed)
Occupational Therapy Evaluation and Discharge Patient Details Name: Walter Carey MRN: [id number] DOB: Mar 13, 1934 Today's Date: 08/01/2013    History of Present Illness pt presents with R hip fx s/p IM nail.  PMHx: dementia with NPH s/p VP shunting    Clinical Impression   Pt presents with acute pain, cognitive deficits and decreased ROM interfering with his independence with self care tasks. Educated pts daughter on proper positioning when eating and that he needs to stay sitting up for 30 minutes following lunch. Pt needs 24/7 supervision/assist with BADLs and therefore D/C of SNF is appropriate. All additional OT needs will be defered to SNF, we will sign off.    Follow Up Recommendations  Supervision/Assistance - 24 hour;SNF    Equipment Recommendations   (TBD at next venue)       Precautions / Restrictions Precautions Precautions: Fall Restrictions Weight Bearing Restrictions: Yes RLE Weight Bearing: Weight bearing as tolerated      Mobility Bed Mobility   General bed mobility comments: Pt could not get to EOB but was able to roll back and forth in bed to allow [place] to change his sheets with +2 for physical assistance.  Transfers                 General transfer comment: Pt unable to perform transfer    Balance Overall balance assessment: Needs assistance                                  ADL Overall ADL's : Needs assistance/impaired Eating/Feeding: Bed level;Total assistance   Grooming: Total assistance;Bed level   Upper Body Bathing: Total assistance;Bed level   Lower Body Bathing: Total assistance;Bed level   Upper Body Dressing : Total assistance;Bed level   Lower Body Dressing: Total assistance;Bed level       Functional mobility during ADLs: Total assistance General ADL Comments: Pt unable to get to EOB for ADLs or reposition himself in bed using the bed rails. Pt stated that he wanted to sit up and eat but required total A to feed  himself.                Pertinent Vitals/Pain C/o pain with movement during tx but pt did not rate. Pt was repositioned in bed.     Hand Dominance Right   Extremity/Trunk Assessment Upper Extremity Assessment Upper Extremity Assessment: Overall WFL for tasks assessed   Lower Extremity Assessment Lower Extremity Assessment: Defer to PT evaluation    Cervical / Trunk Assessment Cervical / Trunk Assessment: Kyphotic   Communication Communication Communication: No difficulties   Cognition Arousal/Alertness: Lethargic Behavior During Therapy: Agitated Overall Cognitive Status: Impaired/Different from baseline Area of Impairment: Attention;Memory;Following commands;Safety/judgement;Awareness;Problem solving Orientation Level: Disoriented to;Place;Time;Situation Current Attention Level: Focused Memory: Decreased recall of precautions;Decreased short-term memory Following Commands: Follows one step commands inconsistently;Follows one step commands with increased time Safety/Judgement: Decreased awareness of safety;Decreased awareness of deficits Awareness: Intellectual Problem Solving: Slow processing;Decreased initiation;Difficulty sequencing;Requires verbal cues;Requires tactile cues General Comments:               Home Living Family/patient expects to be discharged to:: Skilled nursing facility                                        Prior Functioning/Environment  Comments: pt with baseline dementia and unclear level of A needed at D/C.      OT Diagnosis: Generalized weakness;Cognitive deficits;Acute pain   OT Problem List: Decreased strength;Decreased range of motion;Decreased activity tolerance;Decreased knowledge of use of DME or AE;Decreased safety awareness;Impaired balance (sitting and/or standing);Decreased cognition;Decreased knowledge of precautions;Pain   OT Treatment/Interventions:      OT Goals(Current goals can be found in  the care plan section) Acute Rehab OT Goals Patient Stated Goal: none stated                End of Session Nurse Communication:  (pt sitting up in bed with daughter feeding him)  Activity Tolerance: Patient limited by pain Patient left: in bed;with call bell/phone within reach;with bed alarm set;with family/visitor present   Time: 5366-4403 OT Time Calculation (min): 22 min Charges:  OT General Charges $OT Visit: 1 Procedure OT Evaluation $Initial OT Evaluation Tier I: 1 Procedure OT Treatments $Self Care/Home Management : 8-22 mins G-Codes:    Lyda Perone 08-15-13, 2:23 PM

## 2013-08-01 NOTE — Care Management Utilization Note (Signed)
Utilization review completed by Janasha Barkalow N. Kedric Bumgarner, RN BSN 

## 2013-08-01 NOTE — Progress Notes (Signed)
Patient alert and combative while attempting to push diluted Ativan. IVF increased to 150/hr for 10 min bolus to allow for systemic therapy of drug then rate returned to 75 cc/hr. New iv site l hand 22g, 1st attempt with good blood return per charge nurse.
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
## 2013-08-01 NOTE — Progress Notes (Signed)
[facility] PROGRESS NOTE Assessment/Plan: Closed intertrochanteric fracture of hip - S/p ORIF on 7.26.2015. - Avoid narcotics use haldol for agitation. - Pt consult pending. famaily to meet with PMT.  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 - consult PMT.  HTN: - stable; cont home regimen   Probable UTI, leukocytosis;  - ? Treated at SNF:  - start empiric atx, f/u cultures; if negative cultures will d/c antibiotics.  CKD: - stable cont monitoring     Code Status: DNR  Family Communication: d/w patient, family, Versie Starks HPOA Disposition Plan: inpatinet    Consultants:  Ortho  Procedures:  S/p Orthopedic  Antibiotics:  none  HPI/Subjective: Non verbal.  Objective: Filed Vitals:   08/01/13 0205 08/01/13 0400 08/01/13 0555 08/01/13 0627  BP: 153/72   111/76  Pulse: 108   102  Temp: 98.7 F (37.1 C)   99.3 F (37.4 C)  TempSrc: Oral   Oral  Resp:  18    Weight:   76.9 kg (169 lb 8.5 oz)   SpO2: 90% 90%  90%    Intake/Output Summary (Last 24 hours) at 08/01/13 1059 Last data filed at 08/01/13 0643  Gross per 24 hour  Intake   1150 ml  Output    702 ml  Net    448 ml   Filed Weights   08/01/13 0555  Weight: 76.9 kg (169 lb 8.5 oz)    Exam:  General: in no acute distress.  HEENT: No bruits, no goiter.  Heart: Regular rate and rhythm. Lungs: Good air movement, clear Abdomen: Soft, nontender, nondistended, positive bowel sounds.  Neuro: Grossly intact, nonfocal.   Data Reviewed: Basic Metabolic Panel:  Recent Labs Lab 07/31/13 1000 08/01/13 0650  NA 137 137  K 3.8 4.5  CL 99 98  CO2 23 23  GLUCOSE 164* 115*  BUN 28* 40*  CREATININE 1.83* 2.05*  CALCIUM 9.0 8.8   Liver Function Tests:  Recent Labs Lab 07/31/13 1000 08/01/13 0650  AST 15 73*  ALT 10 17    ALKPHOS 74 60  BILITOT 0.4 0.6  PROT 7.0 6.5  ALBUMIN 3.4* 3.1*   No results found for this basename: LIPASE, AMYLASE,  in the last 168 hours No results found for this basename: AMMONIA,  in the last 168 hours CBC:  Recent Labs Lab 07/31/13 1000 08/01/13 0650  WBC 14.1* 13.3*  NEUTROABS 12.4*  --   HGB 11.4* 8.5*  HCT 32.7* 25.6*  MCV 88.1 91.8  PLT 304 257   Cardiac Enzymes: No results found for this basename: CKTOTAL, CKMB, CKMBINDEX, TROPONINI,  in the last 168 hours BNP (last 3 results) No results found for this basename: PROBNP,  in the last 8760 hours CBG: No results found for this basename: GLUCAP,  in the last 168 hours  No results found for this or any previous visit (from the past 240 hour(s)).   Studies: Dg Chest 1 View  07/31/2013   CLINICAL DATA:  fall, hip pain  EXAM: CHEST - 1 VIEW  COMPARISON:  07/10/2012  FINDINGS: VP shunt tubing partially seen over the right hemi thorax. Lungs clear. The mild edema suspected on previous study has resolved. Heart size normal. No effusion. Regional bones unremarkable.  IMPRESSION: 1. No acute disease   Electronically Signed   By: Arne Cleveland M.D.   On: 07/31/2013  09:42   Dg Hip Complete Right  07/31/2013   CLINICAL DATA:  HIP PAIN FALL  EXAM: RIGHT HIP - COMPLETE 2+ VIEW  COMPARISON:  07/21/2003  FINDINGS: There is a comminuted intertrochanteric fracture of the right femur. There are several cm displacement and foreshortening of major fracture fragments. The lesser trochanter is a separate fracture fragment. Femoral head remains located.  IMPRESSION: 1. Comminuted displaced right intertrochanteric femur fracture.   Electronically Signed   By: Arne Cleveland M.D.   On: 07/31/2013 09:40   Dg Femur Right  07/31/2013   CLINICAL DATA:  79 year old male -ORIF right femur fracture.  EXAM: RIGHT FEMUR - 2 VIEW; DG C-ARM 61-120 MIN  COMPARISON:  07/31/2013  FINDINGS: Intraoperative spot films of the right femur are submitted  postoperatively for interpretation.  Nail and screw fixation of an intertrochanteric right femur fracture identified. The fracture is in near-anatomic alignment and position.  No complicating features are identified.  IMPRESSION: Internal fixation of intertrochanteric right femur fracture.   Electronically Signed   By: Hassan Rowan M.D.   On: 07/31/2013 17:35   Dg Femur Right  07/31/2013   CLINICAL DATA:  HIP PAIN FALL  EXAM: RIGHT FEMUR - 2 VIEW  COMPARISON:  07/21/2003  FINDINGS: Comminuted intertrochanteric fracture of the right femur. Several cm of displacement and override of major fracture fragments. The lesser trochanter is a separate fracture fragment. The femoral head remains seated. Distal femur is intact. Patchy femoral-popliteal arterial calcifications.  IMPRESSION: 1. Comminuted displaced intertrochanteric fracture   Electronically Signed   By: Arne Cleveland M.D.   On: 07/31/2013 09:41   Ct Head Wo Contrast  07/31/2013   CLINICAL DATA:  Fall ; dementia  EXAM: CT HEAD WITHOUT CONTRAST  CT CERVICAL SPINE WITHOUT CONTRAST  COMPARISON:  Brain CT July 04, 2013  TECHNIQUE: Multidetector CT imaging of the head and cervical spine was performed following the standard protocol without intravenous contrast. Multiplanar CT image reconstructions of the cervical spine were also generated.  FINDINGS: CT HEAD: There is diffuse ventricular enlargement with less pronounced sulcal enlargement, stable. There is a shunt catheter with the tip in the right lateral ventricle, stable. There is no intracranial mass, hemorrhage, extra-axial fluid collection or midline shift. There is decreased attenuation surrounding the lateral ventricles, stable. There are small lacunar type infarcts in the lateral left lentiform nucleus as well as in both external capsules, stable. No new gray-white compartment lesion. No acute infarct.  Bony calvarium appears intact except for a shunt catheter placement defect in the anterior right  parietal bone. Mastoid air cells are clear. There is ethmoid sinus disease bilaterally.  CT CERVICAL SPINE: There is no fracture or spondylolisthesis. Prevertebral soft tissues and predental space regions are normal. There is moderate disc space narrowing at C4-5, C5-6, and C6-7. There is bony hypertrophy at most levels with multiple areas of as exit foraminal narrowing due to facet hypertrophy. No disc extrusion or stenosis. There is calcification in both carotid arteries.  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 Stable ventricular enlargement with shunt catheter in place, stable from prior study. Decreased attenuation adjacent to the ventricles is most likely due to a combination of small vessel disease and interstitial edema from the chronic ventricular enlargement. No acute infarct apparent. No hemorrhage or mass effect. There is ethmoid sinus disease bilaterally.  CT cervical spine: Multilevel spondylosis and osteoarthritic change. No fracture or spondylolisthesis. Calcification in both carotid arteries.   Electronically Signed  By: Lowella Grip M.D.   On: 07/31/2013 10:02   Ct Cervical Spine Wo Contrast  07/31/2013   CLINICAL DATA:  Fall ; dementia  EXAM: CT HEAD WITHOUT CONTRAST  CT CERVICAL SPINE WITHOUT CONTRAST  COMPARISON:  Brain CT July 04, 2013  TECHNIQUE: Multidetector CT imaging of the head and cervical spine was performed following the standard protocol without intravenous contrast. Multiplanar CT image reconstructions of the cervical spine were also generated.  FINDINGS: CT HEAD: There is diffuse ventricular enlargement with less pronounced sulcal enlargement, stable. There is a shunt catheter with the tip in the right lateral ventricle, stable. There is no intracranial mass, hemorrhage, extra-axial fluid collection or midline shift. There is decreased attenuation surrounding the lateral ventricles, stable. There are small lacunar type  infarcts in the lateral left lentiform nucleus as well as in both external capsules, stable. No new gray-white compartment lesion. No acute infarct.  Bony calvarium appears intact except for a shunt catheter placement defect in the anterior right parietal bone. Mastoid air cells are clear. There is ethmoid sinus disease bilaterally.  CT CERVICAL SPINE: There is no fracture or spondylolisthesis. Prevertebral soft tissues and predental space regions are normal. There is moderate disc space narrowing at C4-5, C5-6, and C6-7. There is bony hypertrophy at most levels with multiple areas of as exit foraminal narrowing due to facet hypertrophy. No disc extrusion or stenosis. There is calcification in both carotid arteries.  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 Stable ventricular enlargement with shunt catheter in place, stable from prior study. Decreased attenuation adjacent to the ventricles is most likely due to a combination of small vessel disease and interstitial edema from the chronic ventricular enlargement. No acute infarct apparent. No hemorrhage or mass effect. There is ethmoid sinus disease bilaterally.  CT cervical spine: Multilevel spondylosis and osteoarthritic change. No fracture or spondylolisthesis. Calcification in both carotid arteries.   Electronically Signed   By: Lowella Grip M.D.   On: 07/31/2013 10:02   Dg C-arm 1-60 Min  07/31/2013   CLINICAL DATA:  79 year old male -ORIF right femur fracture.  EXAM: RIGHT FEMUR - 2 VIEW; DG C-ARM 61-120 MIN  COMPARISON:  07/31/2013  FINDINGS: Intraoperative spot films of the right femur are submitted postoperatively for interpretation.  Nail and screw fixation of an intertrochanteric right femur fracture identified. The fracture is in near-anatomic alignment and position.  No complicating features are identified.  IMPRESSION: Internal fixation of intertrochanteric right femur fracture.    Electronically Signed   By: Hassan Rowan M.D.   On: 07/31/2013 17:35    Scheduled Meds: . cefTRIAXone (ROCEPHIN)  IV  1 g Intravenous Q24H  . diltiazem  240 mg Oral Daily  . docusate sodium  100 mg Oral BID  . enoxaparin (LOVENOX) injection  40 mg Subcutaneous Q24H  . levothyroxine  150 mcg Oral QAC breakfast  . pantoprazole  40 mg Oral Daily  . senna  2 tablet Oral BID  . sertraline  100 mg Oral Daily   Continuous Infusions: . sodium chloride    . sodium chloride       Charlynne Cousins  [facility] Pager [phone]. If 8PM-8AM, please contact night-coverage at [email or web address], password [facility] 08/01/2013, 10:59 AM  LOS: 1 day      **Disclaimer: This note Geoffroy have been dictated with voice recognition software. Similar sounding words can inadvertently be transcribed and this note Wolfert contain transcription errors which Stern not have been corrected upon publication  of note.**

## 2013-08-01 NOTE — Evaluation (Signed)
I have read and agree with this note.   Time AE/WYB:[id number]  Total time: 22 minutes ([place], [place])  [place], OTR/L [phone]

## 2013-08-01 NOTE — Progress Notes (Signed)
Patient continues to be extremely restless, pulling off telemetry monitor, iv fluids off at this time to protect iv access, pt redirected from pulling on foley catheter.

## 2013-08-02 ENCOUNTER — Inpatient Hospital Stay (HOSPITAL_COMMUNITY): Payer: Medicare Other

## 2013-08-02 ENCOUNTER — Encounter (HOSPITAL_COMMUNITY): Payer: Self-pay | Admitting: Orthopedic Surgery

## 2013-08-02 DIAGNOSIS — M79609 Pain in unspecified limb: Secondary | ICD-10-CM

## 2013-08-02 DIAGNOSIS — R5082 Postprocedural fever: Secondary | ICD-10-CM

## 2013-08-02 LAB — URINE CULTURE
Colony Count: NO GROWTH
Culture: NO GROWTH

## 2013-08-02 LAB — CBC
HCT: 21.2 % — ABNORMAL LOW (ref 39.0–52.0)
Hemoglobin: 7.5 g/dL — ABNORMAL LOW (ref 13.0–17.0)
MCH: 31.4 pg (ref 26.0–34.0)
MCHC: 35.4 g/dL (ref 30.0–36.0)
MCV: 88.7 fL (ref 78.0–100.0)
PLATELETS: 241 10*3/uL (ref 150–400)
RBC: 2.39 MIL/uL — ABNORMAL LOW (ref 4.22–5.81)
RDW: 12.8 % (ref 11.5–15.5)
WBC: 13.3 10*3/uL — ABNORMAL HIGH (ref 4.0–10.5)

## 2013-08-02 LAB — URINALYSIS, ROUTINE W REFLEX MICROSCOPIC
Bilirubin Urine: NEGATIVE
Glucose, UA: NEGATIVE mg/dL
Ketones, ur: NEGATIVE mg/dL
NITRITE: NEGATIVE
PH: 6.5 (ref 5.0–8.0)
PROTEIN: 100 mg/dL — AB
Specific Gravity, Urine: 1.021 (ref 1.005–1.030)
Urobilinogen, UA: 1 mg/dL (ref 0.0–1.0)

## 2013-08-02 LAB — URINE MICROSCOPIC-ADD ON

## 2013-08-02 LAB — BASIC METABOLIC PANEL
ANION GAP: 14 (ref 5–15)
BUN: 44 mg/dL — ABNORMAL HIGH (ref 6–23)
CHLORIDE: 102 meq/L (ref 96–112)
CO2: 22 mEq/L (ref 19–32)
CREATININE: 2.07 mg/dL — AB (ref 0.50–1.35)
Calcium: 8.6 mg/dL (ref 8.4–10.5)
GFR calc Af Amer: 34 mL/min — ABNORMAL LOW (ref >90)
GFR, EST NON AFRICAN AMERICAN: 29 mL/min — AB (ref >90)
Glucose, Bld: 161 mg/dL — ABNORMAL HIGH (ref 70–99)
Potassium: 4.1 mEq/L (ref 3.7–5.3)
Sodium: 138 mEq/L (ref 137–147)

## 2013-08-02 MED ORDER — ACETAMINOPHEN 325 MG PO TABS: 650.0000 mg | ORAL_TABLET | Freq: Four times a day (QID) | ORAL | Status: AC | PRN

## 2013-08-02 MED ORDER — DEXTROSE 5 % IV SOLN
1.0000 g | Freq: Two times a day (BID) | INTRAVENOUS | Status: DC
  Administered 2013-08-02 – 2013-08-08 (×12): 1 g via INTRAVENOUS
  Filled 2013-08-02 (×15): qty 1

## 2013-08-02 MED ORDER — VANCOMYCIN HCL IN DEXTROSE 750-5 MG/150ML-% IV SOLN
750.0000 mg | INTRAVENOUS | Status: DC
  Administered 2013-08-02 – 2013-08-06 (×5): 750 mg via INTRAVENOUS
  Filled 2013-08-02 (×5): qty 150

## 2013-08-02 MED ORDER — ENOXAPARIN SODIUM 40 MG/0.4ML ~~LOC~~ SOLN: 40.0000 mg | SUBCUTANEOUS | Status: AC

## 2013-08-02 NOTE — Progress Notes (Addendum)
[facility] PROGRESS NOTE Assessment/Plan: Closed intertrochanteric fracture of hip - S/p ORIF on 7.26.2015. - Avoid narcotics use haldol for agitation. - Pt consult pending. Meeting with PMT on 7.27.2015. - Tylenol for pain, home in 24 hrs. Consult SW for SNF. - Awaiting SNF placement.  Unknown source Fever: - CRX, U/a. - Pt on lovenox, tylenol for fever. 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  HTN: - stable; cont home regimen   Probable UTI, leukocytosis;  - ? Treated at SNF:  - start empiric atx,  negative cultures, will d/c antibiotics.  CKD: - stable cont monitoring     Code Status: DNR  Family Communication: d/w patient, family, Versie Starks HPOA Disposition Plan: inpatinet    Consultants:  Ortho  Procedures:  S/p Orthopedic  Antibiotics:  none  HPI/Subjective: Non verbal.  Objective: Filed Vitals:   08/01/13 0627 08/01/13 2113 08/02/13 0500 08/02/13 0651  BP: 111/76 148/75 105/74   Pulse: 102 108 104   Temp: 99.3 F (37.4 C) 97.8 F (36.6 C) 99 F (37.2 C)   TempSrc: Oral Oral Oral   Resp:  18 18   Weight:    70.4 kg (155 lb 3.3 oz)  SpO2: 90% 92% 90%     Intake/Output Summary (Last 24 hours) at 08/02/13 0905 Last data filed at 08/02/13 0601  Gross per 24 hour  Intake    840 ml  Output    100 ml  Net    740 ml   Filed Weights   08/01/13 0555 08/02/13 0651  Weight: 76.9 kg (169 lb 8.5 oz) 70.4 kg (155 lb 3.3 oz)    Exam:  General: in no acute distress.  HEENT: No bruits, no goiter.  Heart: Regular rate and rhythm. Lungs: Good air movement, clear Abdomen: Soft, nontender, nondistended, positive bowel sounds.  Neuro: Grossly intact, nonfocal.   Data Reviewed: Basic Metabolic Panel:  Recent Labs Lab 07/31/13 1000  08/01/13 0650 08/02/13 0432  NA 137 137 138  K 3.8 4.5 4.1  CL 99 98 102  CO2 23 23 22   GLUCOSE 164* 115* 161*  BUN 28* 40* 44*  CREATININE 1.83* 2.05* 2.07*  CALCIUM 9.0 8.8 8.6   Liver Function Tests:  Recent Labs Lab 07/31/13 1000 08/01/13 0650  AST 15 73*  ALT 10 17  ALKPHOS 74 60  BILITOT 0.4 0.6  PROT 7.0 6.5  ALBUMIN 3.4* 3.1*   No results found for this basename: LIPASE, AMYLASE,  in the last 168 hours No results found for this basename: AMMONIA,  in the last 168 hours CBC:  Recent Labs Lab 07/31/13 1000 08/01/13 0650 08/02/13 0432  WBC 14.1* 13.3* 13.3*  NEUTROABS 12.4*  --   --   HGB 11.4* 8.5* 7.5*  HCT 32.7* 25.6* 21.2*  MCV 88.1 91.8 88.7  PLT 304 257 241   Cardiac Enzymes: No results found for this basename: CKTOTAL, CKMB, CKMBINDEX, TROPONINI,  in the last 168 hours BNP (last 3 results) No results found for this basename: PROBNP,  in the last 8760 hours CBG: No results found for this basename: GLUCAP,  in the last 168 hours  Recent Results (from the past 240 hour(s))  URINE CULTURE     Status: None   Collection Time    07/31/13 10:21 PM  Result Value Ref Range Status   Specimen Description URINE, RANDOM   Final   Special Requests NONE   Final   Culture  Setup Time     Final   Value: 08/01/2013 10:02     Performed at [place]     Final   Value: NO GROWTH     Performed at [facility]   Culture     Final   Value: NO GROWTH     Performed at [facility]   Report Status 08/02/2013 FINAL   Final     Studies: Dg Chest 1 View  07/31/2013   CLINICAL DATA:  fall, hip pain  EXAM: CHEST - 1 VIEW  COMPARISON:  07/10/2012  FINDINGS: VP shunt tubing partially seen over the right hemi thorax. Lungs clear. The mild edema suspected on previous study has resolved. Heart size normal. No effusion. Regional bones unremarkable.  IMPRESSION: 1. No acute disease   Electronically Signed   By: Arne Cleveland  M.D.   On: 07/31/2013 09:42   Dg Hip Complete Right  07/31/2013   CLINICAL DATA:  HIP PAIN FALL  EXAM: RIGHT HIP - COMPLETE 2+ VIEW  COMPARISON:  07/21/2003  FINDINGS: There is a comminuted intertrochanteric fracture of the right femur. There are several cm displacement and foreshortening of major fracture fragments. The lesser trochanter is a separate fracture fragment. Femoral head remains located.  IMPRESSION: 1. Comminuted displaced right intertrochanteric femur fracture.   Electronically Signed   By: Arne Cleveland M.D.   On: 07/31/2013 09:40   Dg Femur Right  07/31/2013   CLINICAL DATA:  79 year old male -ORIF right femur fracture.  EXAM: RIGHT FEMUR - 2 VIEW; DG C-ARM 61-120 MIN  COMPARISON:  07/31/2013  FINDINGS: Intraoperative spot films of the right femur are submitted postoperatively for interpretation.  Nail and screw fixation of an intertrochanteric right femur fracture identified. The fracture is in near-anatomic alignment and position.  No complicating features are identified.  IMPRESSION: Internal fixation of intertrochanteric right femur fracture.   Electronically Signed   By: Hassan Rowan M.D.   On: 07/31/2013 17:35   Dg Femur Right  07/31/2013   CLINICAL DATA:  HIP PAIN FALL  EXAM: RIGHT FEMUR - 2 VIEW  COMPARISON:  07/21/2003  FINDINGS: Comminuted intertrochanteric fracture of the right femur. Several cm of displacement and override of major fracture fragments. The lesser trochanter is a separate fracture fragment. The femoral head remains seated. Distal femur is intact. Patchy femoral-popliteal arterial calcifications.  IMPRESSION: 1. Comminuted displaced intertrochanteric fracture   Electronically Signed   By: Arne Cleveland M.D.   On: 07/31/2013 09:41   Ct Head Wo Contrast  07/31/2013   CLINICAL DATA:  Fall ; dementia  EXAM: CT HEAD WITHOUT CONTRAST  CT CERVICAL SPINE WITHOUT CONTRAST  COMPARISON:  Brain CT July 04, 2013  TECHNIQUE: Multidetector CT imaging of the head and cervical  spine was performed following the standard protocol without intravenous contrast. Multiplanar CT image reconstructions of the cervical spine were also generated.  FINDINGS: CT HEAD: There is diffuse ventricular enlargement with less pronounced sulcal enlargement, stable. There is a shunt catheter with the tip in the right lateral ventricle, stable. There is no intracranial mass, hemorrhage, extra-axial fluid collection or midline shift. There is decreased attenuation surrounding the lateral ventricles, stable. There are small lacunar type infarcts in the lateral left lentiform nucleus as well as in both external capsules, stable. No new gray-white  compartment lesion. No acute infarct.  Bony calvarium appears intact except for a shunt catheter placement defect in the anterior right parietal bone. Mastoid air cells are clear. There is ethmoid sinus disease bilaterally.  CT CERVICAL SPINE: There is no fracture or spondylolisthesis. Prevertebral soft tissues and predental space regions are normal. There is moderate disc space narrowing at C4-5, C5-6, and C6-7. There is bony hypertrophy at most levels with multiple areas of as exit foraminal narrowing due to facet hypertrophy. No disc extrusion or stenosis. There is calcification in both carotid arteries.  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 Stable ventricular enlargement with shunt catheter in place, stable from prior study. Decreased attenuation adjacent to the ventricles is most likely due to a combination of small vessel disease and interstitial edema from the chronic ventricular enlargement. No acute infarct apparent. No hemorrhage or mass effect. There is ethmoid sinus disease bilaterally.  CT cervical spine: Multilevel spondylosis and osteoarthritic change. No fracture or spondylolisthesis. Calcification in both carotid arteries.   Electronically Signed   By: Lowella Grip M.D.   On: 07/31/2013 10:02     Ct Cervical Spine Wo Contrast  07/31/2013   CLINICAL DATA:  Fall ; dementia  EXAM: CT HEAD WITHOUT CONTRAST  CT CERVICAL SPINE WITHOUT CONTRAST  COMPARISON:  Brain CT July 04, 2013  TECHNIQUE: Multidetector CT imaging of the head and cervical spine was performed following the standard protocol without intravenous contrast. Multiplanar CT image reconstructions of the cervical spine were also generated.  FINDINGS: CT HEAD: There is diffuse ventricular enlargement with less pronounced sulcal enlargement, stable. There is a shunt catheter with the tip in the right lateral ventricle, stable. There is no intracranial mass, hemorrhage, extra-axial fluid collection or midline shift. There is decreased attenuation surrounding the lateral ventricles, stable. There are small lacunar type infarcts in the lateral left lentiform nucleus as well as in both external capsules, stable. No new gray-white compartment lesion. No acute infarct.  Bony calvarium appears intact except for a shunt catheter placement defect in the anterior right parietal bone. Mastoid air cells are clear. There is ethmoid sinus disease bilaterally.  CT CERVICAL SPINE: There is no fracture or spondylolisthesis. Prevertebral soft tissues and predental space regions are normal. There is moderate disc space narrowing at C4-5, C5-6, and C6-7. There is bony hypertrophy at most levels with multiple areas of as exit foraminal narrowing due to facet hypertrophy. No disc extrusion or stenosis. There is calcification in both carotid arteries.  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 Stable ventricular enlargement with shunt catheter in place, stable from prior study. Decreased attenuation adjacent to the ventricles is most likely due to a combination of small vessel disease and interstitial edema from the chronic ventricular enlargement. No acute infarct apparent. No hemorrhage or mass effect. There is ethmoid  sinus disease bilaterally.  CT cervical spine: Multilevel spondylosis and osteoarthritic change. No fracture or spondylolisthesis. Calcification in both carotid arteries.   Electronically Signed   By: Lowella Grip M.D.   On: 07/31/2013 10:02   Dg C-arm 1-60 Min  07/31/2013   CLINICAL DATA:  79 year old male -ORIF right femur fracture.  EXAM: RIGHT FEMUR - 2 VIEW; DG C-ARM 61-120 MIN  COMPARISON:  07/31/2013  FINDINGS: Intraoperative spot films of the right femur are submitted postoperatively for interpretation.  Nail and screw fixation of an intertrochanteric right femur fracture identified. The fracture is in near-anatomic alignment and position.  No complicating features  are identified.  IMPRESSION: Internal fixation of intertrochanteric right femur fracture.   Electronically Signed   By: Hassan Rowan M.D.   On: 07/31/2013 17:35    Scheduled Meds: . cefTRIAXone (ROCEPHIN)  IV  1 g Intravenous Q24H  . diltiazem  240 mg Oral Daily  . docusate sodium  100 mg Oral BID  . enoxaparin (LOVENOX) injection  40 mg Subcutaneous Q24H  . levothyroxine  150 mcg Oral QAC breakfast  . pantoprazole  40 mg Oral Daily  . senna  2 tablet Oral BID  . sertraline  100 mg Oral Daily   Continuous Infusions: . sodium chloride    . sodium chloride       Charlynne Cousins  [facility] Pager [phone]. If 8PM-8AM, please contact night-coverage at [email or web address], password [facility] 08/02/2013, 9:05 AM  LOS: 2 days      **Disclaimer: This note Magno have been dictated with voice recognition software. Similar sounding words can inadvertently be transcribed and this note [place] contain transcription errors which Kuznia not have been corrected upon publication of note.**

## 2013-08-02 NOTE — Progress Notes (Signed)
Subjective: 2 Days Post-Op Procedure(s) (LRB): INTRAMEDULLARY (IM) NAIL INTERTROCHANTRIC (Right) Patient's wife at bedside.  Less agitated today according to RN.  Prefers lying on right side on wounds.  Objective: Vital signs in last 24 hours: Temp:  [97.8 F (36.6 C)-102.7 F (39.3 C)] 102.7 F (39.3 C) (07/28 1339) Pulse Rate:  [84-108] 84 (07/28 1339) Resp:  [18] 18 (07/28 1339) BP: (105-148)/(74-79) 136/79 mmHg (07/28 1339) SpO2:  [90 %-97 %] 97 % (07/28 1339) Weight:  [70.4 kg (155 lb 3.3 oz)] 70.4 kg (155 lb 3.3 oz) (07/28 0651)  Intake/Output from previous day: 07/27 0701 - 07/28 0700 In: 840 [P.O.:240; I.V.:600] Out: 100 [Urine:100] Intake/Output this shift: Total I/O In: 360 [P.O.:360] Out: -    Recent Labs  07/31/13 1000 08/01/13 0650 08/02/13 0432  HGB 11.4* 8.5* 7.5*    Recent Labs  08/01/13 0650 08/02/13 0432  WBC 13.3* 13.3*  RBC 2.79* 2.39*  HCT 25.6* 21.2*  PLT 257 241    Recent Labs  08/01/13 0650 08/02/13 0432  NA 137 138  K 4.5 4.1  CL 98 102  CO2 23 22  BUN 40* 44*  CREATININE 2.05* 2.07*  GLUCOSE 115* 161*  CALCIUM 8.8 8.6    Recent Labs  07/31/13 1000 08/01/13 0650  INR 1.07 1.24    PE:  elderly demented male in nad.  R hip wounds C/D/I.  Assessment/Plan: 2 Days Post-Op Procedure(s) (LRB): INTRAMEDULLARY (IM) NAIL INTERTROCHANTRIC (Right) Up with therapy  WBAT on R LE.  Continue lovenox for DVT prophylaxis.    [name] 08/02/2013, 2:13 PM

## 2013-08-02 NOTE — Clinical Social Work Psychosocial (Signed)
Clinical Social Work Department BRIEF PSYCHOSOCIAL ASSESSMENT 08/02/2013  Patient:  Walter Carey, Walter Carey     Account Number:  [id number]     Admit date:  07/31/2013  Clinical Social Worker:  Lovey Newcomer  Date/Time:  08/02/2013 04:06 PM  Referred by:  Physician  Date Referred:  08/02/2013 Referred for  SNF Placement   Other Referral:   Interview type:  Family Other interview type:   Patient's daughter interviewed to complete assessment.    PSYCHOSOCIAL DATA Living Status:  WIFE Admitted from facility:   Level of care:   Primary support name:  Walter Carey and Walter Carey Primary support relationship to patient:  CHILD, ADULT Degree of support available:   Support is good.    CURRENT CONCERNS Current Concerns  Post-Acute Placement   Other Concerns:    SOCIAL WORK ASSESSMENT / PLAN CSW spoke with patient's daughter by phone per wife's request to complete assessment. Patient's daughter states that plan is for patient to DC to SNF when medically stable. Family has a preference for [facility] or [facility]. CSW has confirmed with [facility] that the facility does not currently have any availability. CSW explained SNF search/placement process to family and answered family's questions. CSW will follow up with bed offers once available.   Assessment/plan status:  Psychosocial Support/Ongoing Assessment of Needs Other assessment/ plan:   Complete Fl2, Fax, pASRR   Information/referral to community resources:   CSW contact informaiton and SNF list given.    PATIENT'S/FAMILY'S RESPONSE TO PLAN OF CARE: Patient's family plans for the patient to DC to SNF when stable. CSW will assist.       Liz Beach MSW, [place], [place], [phone]

## 2013-08-02 NOTE — Progress Notes (Signed)
ANTIBIOTIC CONSULT NOTE - INITIAL  Pharmacy Consult for vanc Indication: pneumonia  Allergies  Allergen Reactions  . Asa [Aspirin]   . Erythromycin   . Morphine And Related   . Oxycodone   . Stadol [Butorphanol]   . Tylox [Oxycodone-Acetaminophen]   . Versed [Midazolam]     Patient Measurements: Height: 5' 8.11" (173 cm) Weight: 155 lb 3.3 oz (70.4 kg) IBW/kg (Calculated) : 68.65  Vital Signs: Temp: 102.7 F (39.3 C) (07/28 1339) Temp src: Oral (07/28 1339) BP: 136/79 mmHg (07/28 1339) Pulse Rate: 84 (07/28 1339) Intake/Output from previous day: 07/27 0701 - 07/28 0700 In: 840 [P.O.:240; I.V.:600] Out: 100 [Urine:100] Intake/Output from this shift: Total I/O In: 360 [P.O.:360] Out: -   Labs:  Recent Labs  07/31/13 1000 08/01/13 0650 08/02/13 0432  WBC 14.1* 13.3* 13.3*  HGB 11.4* 8.5* 7.5*  PLT 304 257 241  CREATININE 1.83* 2.05* 2.07*   Estimated Creatinine Clearance: 29.5 ml/min (by C-G formula based on Cr of 2.07). No results found for this basename: Letta Median, VANCORANDOM, GENTTROUGH, GENTPEAK, GENTRANDOM, TOBRATROUGH, TOBRAPEAK, TOBRARND, AMIKACINPEAK, AMIKACINTROU, AMIKACIN,  in the last 72 hours   Microbiology: Recent Results (from the past 720 hour(s))  URINE CULTURE     Status: None   Collection Time    07/31/13 10:21 PM      Result Value Ref Range Status   Specimen Description URINE, RANDOM   Final   Special Requests NONE   Final   Culture  Setup Time     Final   Value: 08/01/2013 10:02     Performed at [facility]     Final   Value: NO GROWTH     Performed at [facility]   Culture     Final   Value: NO GROWTH     Performed at [facility]   Report Status 08/02/2013 FINAL   Final    Medical History: Past Medical History  Diagnosis Date  . Hypertension   . Dementia   . Hydrocephalus with operating shunt   . Neuropathy   . AVNRT (AV nodal re-entry tachycardia)   . Falls   .  CKD (chronic kidney disease), stage III   . Normal pressure hydrocephalus 12/24/2012  . Abnormality of gait 12/24/2012  . Dyslipidemia   . Hypothyroidism   . Personal history of subdural hematoma     Bilateral, following VP shunt placement  . Migraine headache   . Cancer     Prostate cancer  . Memory loss     Medications:  Prescriptions prior to admission  Medication Sig Dispense Refill  . amitriptyline (ELAVIL) 25 MG tablet Take 25 mg by mouth at bedtime.      Marland Kitchen CALCIUM-VITAMIN D PO Take 1 tablet by mouth daily.       Marland Kitchen diltiazem (CARDIZEM CD) 240 MG 24 hr capsule Take 1 capsule (240 mg total) by mouth daily.      Marland Kitchen donepezil (ARICEPT) 10 MG tablet Take 10 mg by mouth at bedtime.      . fish oil-omega-3 fatty acids 1000 MG capsule Take 1 g by mouth 3 (three) times daily.       Marland Kitchen levothyroxine (SYNTHROID, LEVOTHROID) 150 MCG tablet Take 150 mcg by mouth daily before breakfast.      . Multiple Vitamin (MULTIVITAMIN WITH MINERALS) TABS tablet Take 1 tablet by mouth daily.      Marland Kitchen omeprazole (PRILOSEC) 20 MG capsule Take 20 mg by  mouth 2 (two) times daily.      . QUEtiapine (SEROQUEL) 25 MG tablet Take 1 tablet (25 mg total) by mouth at bedtime.      . risperiDONE (RISPERDAL) 0.25 MG tablet Take 1 tablet (0.25 mg total) by mouth 2 (two) times daily as needed (for agitation).  30 tablet  0  . sertraline (ZOLOFT) 100 MG tablet Take 100 mg by mouth daily.      Marland Kitchen thiamine (VITAMIN B-1) 100 MG tablet Take 300 mg by mouth daily.      . vitamin E 400 UNIT capsule Take 400 Units by mouth 2 (two) times daily.       Scheduled:  . ceFEPime (MAXIPIME) IV  1 g Intravenous Q12H  . diltiazem  240 mg Oral Daily  . docusate sodium  100 mg Oral BID  . enoxaparin (LOVENOX) injection  40 mg Subcutaneous Q24H  . levothyroxine  150 mcg Oral QAC breakfast  . pantoprazole  40 mg Oral Daily  . senna  2 tablet Oral BID  . sertraline  100 mg Oral Daily   Infusions:  . sodium chloride    . sodium chloride      Assessment: 39 yoM to begin vanc for possible pneumonia.  Also to begin cefepime.  Noted CKD with Crcl~29.  WBC elevated and Tmax 102.7.    Goal of Therapy:  Vancomycin trough level 15-20 mcg/ml  Plan:  Begin Vanc 750mg  Q24H Monitor clinical progress, renal function, levels when appropriate  Thank you, Vivia Ewing, PharmD Clinical Pharmacist - Resident Pager: [phone] Pharmacy: [phone] 08/02/2013 2:56 PM

## 2013-08-02 NOTE — Progress Notes (Signed)
Speech Language Pathology Treatment: Dysphagia  Patient Details Name: Dhairya Corales Cardona MRN: [id number] DOB: Jun 28, 1934 Today's Date: 08/02/2013 Time: 5277-8242 SLP Time Calculation (min): 15 min  Assessment / Plan / Recommendation Clinical Impression  Patient seen for skilled dysphagia treatment during lunch.  SLP assisted patient with up right positioning and set-up of lunch tray.  SLP also facilitated self-feeding with Max contextual, verbal, visual and tactile cues for initiation, pacing and safety with PO intake.  Patient consumed Dys 1 textures and thin liquids via cup with munching, prolonged transit as well as oral residue post swallow.  Cues to swallow again or take a sip where effective at reducing residue residue and patient did not demonstrate any overt s/s of aspiration.  Session was ended when patient reported that his back pain (? accuracy of location back versus hip) was limiting his ability to sit up and eat.  RN was notified.  Recommend to continue with current plan of care.     HPI HPI: 79 year old male admitted 07/31/13 due to fall and hip fracture. PMH significant for dementia. BSE ordered to evaluate swallow function and safety and identify least restrictive diet.   Pertinent Vitals None  SLP Plan  Continue with current plan of care    Recommendations Diet recommendations: Dysphagia 1 (puree);Thin liquid Liquids provided via: Cup;Straw Medication Administration: Whole meds with puree Supervision: Staff to assist with self feeding;Full supervision/cueing for compensatory strategies Compensations: Slow rate;Small sips/bites;Follow solids with liquid Postural Changes and/or Swallow Maneuvers: Seated upright 90 degrees;Upright 30-60 min after meal              Oral Care Recommendations: Oral care BID Follow up Recommendations: Skilled Nursing facility;24 hour supervision/assistance Plan: Continue with current plan of care    GO     Carmelia Roller.,  CCC-SLP [phone]  [place] 08/02/2013, 1:19 PM

## 2013-08-02 NOTE — Discharge Instructions (Signed)
Bear weight as tolerated on the right leg.  Change dressings daily and as needed with dry gauze and paper tape.

## 2013-08-02 NOTE — Clinical Social Work Psychosocial (Signed)
Clinical Social Work Department BRIEF PSYCHOSOCIAL ASSESSMENT 08/02/2013  Patient:  JARROD, MCENERY     Account Number:  [id number]     Admit date:  07/31/2013  Clinical Social Worker:  Lovey Newcomer  Date/Time:  08/02/2013 10:00 AM  Referred by:  Physician  Date Referred:  08/02/2013 Referred for  SNF Placement  Residential hospice placement   Other Referral:   Interview type:  Family Other interview type:   Patient's son interviewed    PSYCHOSOCIAL DATA Living Status:  FACILITY Admitted from facility:  [facility] Level of care:  [place] Primary support name:  Barnabas Lister Primary support relationship to patient:  CHILD, ADULT Degree of support available:   Support is good    CURRENT CONCERNS Current Concerns  Post-Acute Placement   Other Concerns:    SOCIAL WORK ASSESSMENT / PLAN CSW spoke with patient's son by phone to complete assessment. Patient's son wishes for patient to return to [facility] with hospice services to receive EOL care. Patient has lived at facility with his wife for over 8 years and the family is happy with the care the patient receives at the facility. CSW will assist with DC. Patient's son seems saddened by the difficult choices he has had to make regarding his father's care.   Assessment/plan status:  Psychosocial Support/Ongoing Assessment of Needs Other assessment/ plan:   Complete FL2, Fax, PASRR   Information/referral to community resources:   CSW contact information given.    PATIENT'S/FAMILY'S RESPONSE TO PLAN OF CARE: Patient's son Barnabas Lister plans for patient to DC to [facility] with hospice services today. CSW will assist.       Liz Beach MSW, [place], [place], [phone]

## 2013-08-02 NOTE — Progress Notes (Signed)
Patient Walter Carey      DOB: 12/18/1934      OPF:[id number]   Palliative Medicine Team at [facility] Progress Note    Subjective:  Patient pleasantly confused. Dose ok if not bothered too much prefers to be in bed. Noted new fever.  Being worked up.  Family upset that SW did not follow through with faxing out referal but new SW on top of it.     Filed Vitals:   08/02/13 1339  BP: 136/79  Pulse: 84  Temp: 102.7 F (39.3 C)  Resp: 18   Physical exam:  General: no acute distress, pleasantly demented Chest decreased but clear anteriorly, CVS: regular, S1, S2 Abd: soft, Ext: no edema, won't let me access his wound Neuro pleasantly confused.  Assessment and plan: 79 yr old with advanced alzheimer's s/p hip fracture.  New fever being worked up.  Family working with SW on placement.  Family aware of hospice eligibility not there yet. Goal to treat the treatable.  1. Dnr  2. Pain/Fever Tylenol  3.  Agitation continue prn meds. Consider schedule Risperdal to prevent mood swings.  Total time 15 min  Arbell Wycoff L. Lovena Le, MD MBA The Palliative Medicine Team at [facility] Phone: [phone] Pager: [phone] ( Use team phone after hours)

## 2013-08-02 NOTE — Progress Notes (Signed)
Routine vital signs were taken at 1339. Temp of 102.7 was recorded. 650mg  of PO tylenol was given and Dr. Aileen Fass was texted paged about result. Will continue to monitor.

## 2013-08-02 NOTE — Progress Notes (Signed)
VASCULAR LAB PRELIMINARY  PRELIMINARY  PRELIMINARY  PRELIMINARY  Bilateral lower extremity venous duplex completed.    Preliminary report:  Bilateral:  No obvious evidence of DVT, superficial thrombosis, or Baker's Cyst.   Grainne Knights, RVS 08/02/2013, 5:10 PM

## 2013-08-02 NOTE — Clinical Social Work Note (Addendum)
Clinical Social Work Department CLINICAL SOCIAL WORK PLACEMENT NOTE 08/02/2013  Patient:  Walter Carey, Walter Carey  Account Number:  [id number] Admit date:  07/31/2013  Clinical Social Worker:  Kemper Durie, [place]  Date/time:  08/02/2013 04:12 PM  Clinical Social Work is seeking post-discharge placement for this patient at the following level of care:   SKILLED NURSING   (*CSW will update this form in Epic as items are completed)   08/02/2013  Patient/family provided with [place] Department of Clinical Social Work's list of facilities offering this level of care within the geographic area requested by the patient (or if unable, by the patient's family).  08/02/2013  Patient/family informed of their freedom to choose among providers that offer the needed level of care, that participate in Medicare, Medicaid or managed care program needed by the patient, have an available bed and are willing to accept the patient.  08/02/2013  Patient/family informed of [facility]' ownership interest in [facility], as well as of the fact that they are under no obligation to receive care at this facility.  PASARR submitted to EDS on 08/02/2013 PASARR number received on 08/02/2013  FL2 transmitted to all facilities in geographic area requested by pt/family on  08/02/2013 FL2 transmitted to all facilities within larger geographic area on   Patient informed that his/her managed care company has contracts with or will negotiate with  certain facilities, including the following:     Patient/family informed of bed offers received:  08/02/2013 Patient chooses bed at [place] Physician recommends and patient chooses bed at    Patient to be transferred to [place] on  08/08/2013 Patient to be transferred to facility by [facility] Patient and family notified of transfer on 08/08/2013 Name of family member notified:  Versie Starks, pt's daughter  The following physician  request were entered in Epic:   Additional Comments:    Liz Beach MSW, [place], [place], [phone]  Lubertha Sayres, MSW, [facility] Licensed Clinical Social Worker [phone] and [phone]

## 2013-08-03 ENCOUNTER — Inpatient Hospital Stay (HOSPITAL_COMMUNITY): Payer: Medicare Other

## 2013-08-03 DIAGNOSIS — R1314 Dysphagia, pharyngoesophageal phase: Secondary | ICD-10-CM

## 2013-08-03 LAB — CBC
HCT: 21.2 % — ABNORMAL LOW (ref 39.0–52.0)
HEMOGLOBIN: 7.2 g/dL — AB (ref 13.0–17.0)
MCH: 30.8 pg (ref 26.0–34.0)
MCHC: 34 g/dL (ref 30.0–36.0)
MCV: 90.6 fL (ref 78.0–100.0)
PLATELETS: 233 10*3/uL (ref 150–400)
RBC: 2.34 MIL/uL — ABNORMAL LOW (ref 4.22–5.81)
RDW: 13.1 % (ref 11.5–15.5)
WBC: 11 10*3/uL — ABNORMAL HIGH (ref 4.0–10.5)

## 2013-08-03 LAB — ABO/RH: ABO/RH(D): O POS

## 2013-08-03 LAB — PREPARE RBC (CROSSMATCH)

## 2013-08-03 NOTE — Progress Notes (Signed)
Speech Language Pathology Treatment: Dysphagia  Patient Details Name: Walter Carey MRN: [id number] DOB: July 15, 1934 Today's Date: 08/03/2013 Time: 9371-6967 SLP Time Calculation (min): 10 min  Assessment / Plan / Recommendation Clinical Impression  Skilled treatment session focused on addressing dysphagia goals.  SLP facilitated session with skilled observation of PO intake as well as Max multimodal cues for self-feeding-Total assist.  Patient demonstrated decreased munching of puree textures as well as more timely transit, therefore Dys 2 textures were administered.  Patient demonstrated a slightly prolonged oral phase with minced solids due to reduced mastication, which SLP suspects was a result of patient refusing to wear bottom dentures.  SLP recommends next session address toleration of Dys 2 textures throughout a meal; RN present at end of session and in agreement.     HPI HPI: 79 year old male admitted 07/31/13 due to fall and hip fracture. PMH significant for dementia. BSE ordered to evaluate swallow function and safety and identify least restrictive diet.   Pertinent Vitals Increased temp; Chest x-ray clear; Question UTI  SLP Plan  Continue with current plan of care    Recommendations Diet recommendations: Dysphagia 1 (puree);Thin liquid Liquids provided via: Cup;Straw Medication Administration: Whole meds with puree Supervision: Staff to assist with self feeding;Full supervision/cueing for compensatory strategies;Patient able to self feed Compensations: Slow rate;Small sips/bites;Follow solids with liquid Postural Changes and/or Swallow Maneuvers: Seated upright 90 degrees;Upright 30-60 min after meal              Oral Care Recommendations: Oral care BID Follow up Recommendations: Skilled Nursing facility;24 hour supervision/assistance Plan: Continue with current plan of care    GO     Carmelia Roller., CCC-SLP [phone]  [place] 08/03/2013, 4:12 PM

## 2013-08-03 NOTE — Progress Notes (Addendum)
[facility] PROGRESS NOTE  Walter Carey CZY:[id number] DOB: Feb 10, 1934 DOA: 07/31/2013 PCP: Marjorie Smolder, MD  Assessment/Plan: Principal Problem:   Closed intertrochanteric fracture of hip Active Problems:   Dementia   Falls   Normal pressure hydrocephalus   Agitation   Hip fracture     PROGRESS NOTE  Assessment/Plan:  Closed intertrochanteric fracture of hip  - S/p ORIF on 7.26.2015.  - Avoid narcotics use haldol for agitation.  - Pt consult pending. Meeting with PMT on 7.27.2015.  - Tylenol for pain, home in 24 hrs. Consult SW for SNF.  - Awaiting SNF placement.   Unknown source Fever:  - CRX yesterday was negative, U/a. , Repeat chest x-ray today Most likely secondary to aspiration, continue vancomycin and cefepime empirically - Pt on lovenox, tylenol for fever.  - Dys 1 diet, start vanc and cefepime  Follow blood culture  Acute blood loss anemia Hemoglobin drop from 11.6 and 7.2 today Most likely lost blood subsequent to the fall, he has a big bruise in his right flank Transfuse 1 unit today   Fall:  - Frequent falls due to NPH, dementia; cognitive impairment, gait disturbance, and urinary incontinence  - s/p VP shunt, likely not helpful since progressive disease;  - hold amitriptyline.   HTN:  - stable; cont home regimen   Probable UTI, leukocytosis;  - ? Treated at SNF:  - start empiric atx, negative cultures, will d/c antibiotics.   CKD:  - stable cont monitoring   Code Status: DNR  Family Communication: d/w patient, family, Versie Starks [place] , [place] Disposition Plan: 1-2 days to SNF Consultants:  Ortho Procedures:  S/p Orthopedic Antibiotics:  none HPI/Subjective:  Non verbal.   Objective: Filed Vitals:   08/02/13 2121 08/03/13 0000 08/03/13 0400 08/03/13 0500  BP: 123/73     Pulse: 160     Temp: 100.1 F (37.8 C)     TempSrc: Oral     Resp: 18 18 18    Height:      Weight:    73.6 kg (162 lb 4.1 oz)  SpO2:  92% 92% 92%     Intake/Output Summary (Last 24 hours) at 08/03/13 1027 Last data filed at 08/03/13 0826  Gross per 24 hour  Intake 1216.25 ml  Output    100 ml  Net 1116.25 ml    Exam: General: in no acute distress.  HEENT: No bruits, no goiter.  Heart: Regular rate and rhythm.  Lungs: Good air movement, clear  Abdomen: Soft, nontender, nondistended, positive bowel sounds.  Neuro: Grossly intact, nonfocal.    Data Reviewed: Basic Metabolic Panel:  Recent Labs Lab 07/31/13 1000 08/01/13 0650 08/02/13 0432  NA 137 137 138  K 3.8 4.5 4.1  CL 99 98 102  CO2 23 23 22   GLUCOSE 164* 115* 161*  BUN 28* 40* 44*  CREATININE 1.83* 2.05* 2.07*  CALCIUM 9.0 8.8 8.6    Liver Function Tests:  Recent Labs Lab 07/31/13 1000 08/01/13 0650  AST 15 73*  ALT 10 17  ALKPHOS 74 60  BILITOT 0.4 0.6  PROT 7.0 6.5  ALBUMIN 3.4* 3.1*   No results found for this basename: LIPASE, AMYLASE,  in the last 168 hours No results found for this basename: AMMONIA,  in the last 168 hours  CBC:  Recent Labs Lab 07/31/13 1000 08/01/13 0650 08/02/13 0432 08/03/13 0440  WBC 14.1* 13.3* 13.3* 11.0*  NEUTROABS 12.4*  --   --   --   HGB  11.4* 8.5* 7.5* 7.2*  HCT 32.7* 25.6* 21.2* 21.2*  MCV 88.1 91.8 88.7 90.6  PLT 304 257 241 233    Cardiac Enzymes: No results found for this basename: CKTOTAL, CKMB, CKMBINDEX, TROPONINI,  in the last 168 hours BNP (last 3 results) No results found for this basename: PROBNP,  in the last 8760 hours   CBG: No results found for this basename: GLUCAP,  in the last 168 hours  Recent Results (from the past 240 hour(s))  URINE CULTURE     Status: None   Collection Time    07/31/13 10:21 PM      Result Value Ref Range Status   Specimen Description URINE, RANDOM   Final   Special Requests NONE   Final   Culture  Setup Time     Final   Value: 08/01/2013 10:02     Performed at [place]     Final   Value: NO GROWTH      Performed at [facility]   Culture     Final   Value: NO GROWTH     Performed at [facility]   Report Status 08/02/2013 FINAL   Final     Studies: Dg Chest 1 View  07/31/2013   CLINICAL DATA:  fall, hip pain  EXAM: CHEST - 1 VIEW  COMPARISON:  07/10/2012  FINDINGS: VP shunt tubing partially seen over the right hemi thorax. Lungs clear. The mild edema suspected on previous study has resolved. Heart size normal. No effusion. Regional bones unremarkable.  IMPRESSION: 1. No acute disease   Electronically Signed   By: Arne Cleveland M.D.   On: 07/31/2013 09:42   Dg Hip Complete Right  07/31/2013   CLINICAL DATA:  HIP PAIN FALL  EXAM: RIGHT HIP - COMPLETE 2+ VIEW  COMPARISON:  07/21/2003  FINDINGS: There is a comminuted intertrochanteric fracture of the right femur. There are several cm displacement and foreshortening of major fracture fragments. The lesser trochanter is a separate fracture fragment. Femoral head remains located.  IMPRESSION: 1. Comminuted displaced right intertrochanteric femur fracture.   Electronically Signed   By: Arne Cleveland M.D.   On: 07/31/2013 09:40   Dg Femur Right  07/31/2013   CLINICAL DATA:  79 year old male -ORIF right femur fracture.  EXAM: RIGHT FEMUR - 2 VIEW; DG C-ARM 61-120 MIN  COMPARISON:  07/31/2013  FINDINGS: Intraoperative spot films of the right femur are submitted postoperatively for interpretation.  Nail and screw fixation of an intertrochanteric right femur fracture identified. The fracture is in near-anatomic alignment and position.  No complicating features are identified.  IMPRESSION: Internal fixation of intertrochanteric right femur fracture.   Electronically Signed   By: Hassan Rowan M.D.   On: 07/31/2013 17:35   Dg Femur Right  07/31/2013   CLINICAL DATA:  HIP PAIN FALL  EXAM: RIGHT FEMUR - 2 VIEW  COMPARISON:  07/21/2003  FINDINGS: Comminuted intertrochanteric fracture of the right femur. Several cm of displacement and override of  major fracture fragments. The lesser trochanter is a separate fracture fragment. The femoral head remains seated. Distal femur is intact. Patchy femoral-popliteal arterial calcifications.  IMPRESSION: 1. Comminuted displaced intertrochanteric fracture   Electronically Signed   By: Arne Cleveland M.D.   On: 07/31/2013 09:41   Ct Head Wo Contrast  07/31/2013   CLINICAL DATA:  Fall ; dementia  EXAM: CT HEAD WITHOUT CONTRAST  CT CERVICAL SPINE WITHOUT CONTRAST  COMPARISON:  Brain  CT July 04, 2013  TECHNIQUE: Multidetector CT imaging of the head and cervical spine was performed following the standard protocol without intravenous contrast. Multiplanar CT image reconstructions of the cervical spine were also generated.  FINDINGS: CT HEAD: There is diffuse ventricular enlargement with less pronounced sulcal enlargement, stable. There is a shunt catheter with the tip in the right lateral ventricle, stable. There is no intracranial mass, hemorrhage, extra-axial fluid collection or midline shift. There is decreased attenuation surrounding the lateral ventricles, stable. There are small lacunar type infarcts in the lateral left lentiform nucleus as well as in both external capsules, stable. No new gray-white compartment lesion. No acute infarct.  Bony calvarium appears intact except for a shunt catheter placement defect in the anterior right parietal bone. Mastoid air cells are clear. There is ethmoid sinus disease bilaterally.  CT CERVICAL SPINE: There is no fracture or spondylolisthesis. Prevertebral soft tissues and predental space regions are normal. There is moderate disc space narrowing at C4-5, C5-6, and C6-7. There is bony hypertrophy at most levels with multiple areas of as exit foraminal narrowing due to facet hypertrophy. No disc extrusion or stenosis. There is calcification in both carotid arteries.  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 Stable ventricular  enlargement with shunt catheter in place, stable from prior study. Decreased attenuation adjacent to the ventricles is most likely due to a combination of small vessel disease and interstitial edema from the chronic ventricular enlargement. No acute infarct apparent. No hemorrhage or mass effect. There is ethmoid sinus disease bilaterally.  CT cervical spine: Multilevel spondylosis and osteoarthritic change. No fracture or spondylolisthesis. Calcification in both carotid arteries.   Electronically Signed   By: Lowella Grip M.D.   On: 07/31/2013 10:02   Ct Head Wo Contrast  07/05/2013   [facility]  NEUROIMAGING REPORT   STUDY DATE: 07/04/13 PATIENT NAME: Santo Zahradnik Kothari DOB: [birth year] MRN: [id number]  ORDERING CLINICIAN: Kathrynn Ducking, MD  CLINICAL HISTORY: 79 year old male with NPH.  EXAM: CT head (without)  TECHNIQUE: CT scan of the head was obtained utilizing 5 mm axial slices  from the skull base to the vertex. CONTRAST: no IMAGING SITE: [facility]   FINDINGS:  The cortical sulci, fissures and cisterns are normal in size and  appearance.  Lateral, third and fourth ventricle are notable for moderate  to severe ventriculomegaly. There is a right frontal intra-ventricular  shunt catheter. No extra-axial fluid collections are seen.  No  intracranial hemorrhage.  No evidence of mass effect or midline shift.   There is moderate periventricular and subcortical chronic small vessel  ischemic disease vs transependymal edema.    The orbits and their contents, paranasal sinuses and calvarium are  unremarkable.      07/05/2013   Abnormal CT head (without) demonstrating: 1. Moderate to severe ventriculomegaly.  2. Moderate periventricular and subcortical chronic small vessel ischemic  disease vs transependymal edema.  3. No acute findings. No significant change from CT on 07/07/12.   INTERPRETING PHYSICIAN:  Penni Bombard, MD Certified in Neurology, Neurophysiology  and Neuroimaging  [facility] [address], [place], [place] [address] [phone]   Ct Cervical Spine Wo Contrast  07/31/2013   CLINICAL DATA:  Fall ; dementia  EXAM: CT HEAD WITHOUT CONTRAST  CT CERVICAL SPINE WITHOUT CONTRAST  COMPARISON:  Brain CT July 04, 2013  TECHNIQUE: Multidetector CT imaging of the head and cervical spine was performed  following the standard protocol without intravenous contrast. Multiplanar CT image reconstructions of the cervical spine were also generated.  FINDINGS: CT HEAD: There is diffuse ventricular enlargement with less pronounced sulcal enlargement, stable. There is a shunt catheter with the tip in the right lateral ventricle, stable. There is no intracranial mass, hemorrhage, extra-axial fluid collection or midline shift. There is decreased attenuation surrounding the lateral ventricles, stable. There are small lacunar type infarcts in the lateral left lentiform nucleus as well as in both external capsules, stable. No new gray-white compartment lesion. No acute infarct.  Bony calvarium appears intact except for a shunt catheter placement defect in the anterior right parietal bone. Mastoid air cells are clear. There is ethmoid sinus disease bilaterally.  CT CERVICAL SPINE: There is no fracture or spondylolisthesis. Prevertebral soft tissues and predental space regions are normal. There is moderate disc space narrowing at C4-5, C5-6, and C6-7. There is bony hypertrophy at most levels with multiple areas of as exit foraminal narrowing due to facet hypertrophy. No disc extrusion or stenosis. There is calcification in both carotid arteries.  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 Stable ventricular enlargement with shunt catheter in place, stable from prior study. Decreased attenuation adjacent to the ventricles is most likely due to a combination of small vessel disease and interstitial edema from  the chronic ventricular enlargement. No acute infarct apparent. No hemorrhage or mass effect. There is ethmoid sinus disease bilaterally.  CT cervical spine: Multilevel spondylosis and osteoarthritic change. No fracture or spondylolisthesis. Calcification in both carotid arteries.   Electronically Signed   By: Lowella Grip M.D.   On: 07/31/2013 10:02   Dg Chest Port 1 View  08/02/2013   CLINICAL DATA:  Fever.  EXAM: PORTABLE CHEST - 1 VIEW  COMPARISON:  Chest radiograph 07/31/2013.  FINDINGS: Patient is rotated. Stable enlarged cardiac and mediastinal contours. Low lung volumes. No consolidative pulmonary opacities. No pleural effusion or pneumothorax. Showing catheter overlies the right hemi thorax.  IMPRESSION: No acute cardiopulmonary process.   Electronically Signed   By: Lovey Newcomer M.D.   On: 08/02/2013 18:26   Dg C-arm 1-60 Min  07/31/2013   CLINICAL DATA:  79 year old male -ORIF right femur fracture.  EXAM: RIGHT FEMUR - 2 VIEW; DG C-ARM 61-120 MIN  COMPARISON:  07/31/2013  FINDINGS: Intraoperative spot films of the right femur are submitted postoperatively for interpretation.  Nail and screw fixation of an intertrochanteric right femur fracture identified. The fracture is in near-anatomic alignment and position.  No complicating features are identified.  IMPRESSION: Internal fixation of intertrochanteric right femur fracture.   Electronically Signed   By: Hassan Rowan M.D.   On: 07/31/2013 17:35    Scheduled Meds: . ceFEPime (MAXIPIME) IV  1 g Intravenous Q12H  . diltiazem  240 mg Oral Daily  . docusate sodium  100 mg Oral BID  . enoxaparin (LOVENOX) injection  40 mg Subcutaneous Q24H  . levothyroxine  150 mcg Oral QAC breakfast  . pantoprazole  40 mg Oral Daily  . senna  2 tablet Oral BID  . sertraline  100 mg Oral Daily  . vancomycin  750 mg Intravenous Q24H   Continuous Infusions: . sodium chloride    . sodium chloride 75 mL/hr at 08/02/13 1543    Principal Problem:   Closed  intertrochanteric fracture of hip Active Problems:   Dementia   Falls   Normal pressure hydrocephalus   Agitation   Hip fracture    Time  spent: 40 minutes   [facility] Pager [phone]. If 8PM-8AM, please contact night-coverage at [email or web address], password [facility] 08/03/2013, 10:27 AM  LOS: 3 days

## 2013-08-03 NOTE — Clinical Social Work Note (Signed)
CSW has still not heard back from family regarding their choice of facility. CSW went by room to discuss family's choice but no family at bedside.   Liz Beach MSW, [place], [place], [phone]

## 2013-08-03 NOTE — Clinical Social Work Note (Signed)
Message left with daughter to inform of bed offers received. CSW has requested that daughter contact CSW back.  Liz Beach MSW, [place], [place], [phone]

## 2013-08-03 NOTE — Clinical Social Work Note (Signed)
CSW was able to reach patient's son Walter Carey. CSW updated him on bed offers received. Walter Carey states that he will discuss offers with sister and make decision by tomorrow. Family is leaning more towards [facility] and [facility]. CSW will follow.  Liz Beach MSW, [place], [place], [phone]

## 2013-08-03 NOTE — Progress Notes (Signed)
Subjective: 3 Days Post-Op Procedure(s) (LRB): INTRAMEDULLARY (IM) NAIL INTERTROCHANTRIC (Right) Patient reports pain as mild. He states that he is feeling better today and that his pain is minimal. He denies any new concerns or issues. He denies any new fatigue, HA, SOB, chest pain, N/V/D, fever or chills, calf pain or extremity swelling.  Objective: Vital signs in last 24 hours: Temp:  [100.1 F (37.8 C)-102.7 F (39.3 C)] 100.1 F (37.8 C) (07/28 2121) Pulse Rate:  [84-160] 160 (07/28 2121) Resp:  [18] 18 (07/29 0400) BP: (123-136)/(73-79) 123/73 mmHg (07/28 2121) SpO2:  [92 %-97 %] 92 % (07/29 0400) Weight:  [73.6 kg (162 lb 4.1 oz)] 73.6 kg (162 lb 4.1 oz) (07/29 0500)  Intake/Output from previous day: 07/28 0701 - 07/29 0700 In: 1431.3 [P.O.:360; I.V.:1071.3] Out: 100 [Urine:100] Intake/Output this shift: Total I/O In: 25 [P.O.:25] Out: -    Recent Labs  08/01/13 0650 08/02/13 0432 08/03/13 0440  HGB 8.5* 7.5* 7.2*    Recent Labs  08/02/13 0432 08/03/13 0440  WBC 13.3* 11.0*  RBC 2.39* 2.34*  HCT 21.2* 21.2*  PLT 241 233    Recent Labs  08/01/13 0650 08/02/13 0432  NA 137 138  K 4.5 4.1  CL 98 102  CO2 23 22  BUN 40* 44*  CREATININE 2.05* 2.07*  GLUCOSE 115* 161*  CALCIUM 8.8 8.6    Recent Labs  08/01/13 0650  INR 1.24    Patient is a well developed, well nourished elderly man in nad. He lays comfortably in bed on his right side. He is A and O x4. Mood and affect are appropriate; he is not agitated. EOMI. Respirations normal and unlabored. Moderate ecchymoses noted around the fracture site. Wound is C/D/I. No calf tenderness or palpable cords. No LE swelling noted. Distal sensation intact bilaterally. DP and PT pulses palpable at 1+ bilaterally. No lymphadenopathy.   Assessment/Plan: 3 Days Post-Op Procedure(s) (LRB): INTRAMEDULLARY (IM) NAIL INTERTROCHANTRIC (Right) Discharge to SNF Continue DVT prophylaxis with Lovenox WBAT on  RLE  Ravi Tuccillo 08/03/2013, 10:08 AM

## 2013-08-03 NOTE — Progress Notes (Signed)
Patient Walter Carey      DOB: 1934-03-29      OFH:[id number]   Palliative Medicine Team at [facility] Progress Note    Subjective: patient alone lying in be no acute distress. Not trying to get up.  He will speak softly when addressed. Low grade fever over night.  Filed Vitals:   08/03/13 0400  BP:   Pulse:   Resp: 18   Physical exam:  Chest clear anteriorly CVS: regular Ext: warm, no mottling, lying in fetal position Neuro: pleasantly confused.   Assessment and plan: 79 yr old male with hydrocephalous, VP shunt in place. Patient at baseline when ambulatory is disinhibited with regard to bowel and bladder function and had been more than his wife could handle.  He currently is more apt to lay in bed and has not been too bad with his behavior. Once you get to know/recognize you he seems to respond better.     1.  DNR  2.  Fever unknown source could be aspiration but xray not supporting that. On abx. No DVT.  Continue to workup waiting for [facility].  3. Dysphagia:  Modified diet    Total time: 15 min  No family at the bedside.  Walter Hobson L. Lovena Le, MD MBA The Palliative Medicine Team at [facility] Phone: [phone] Pager: [phone] ( Use team phone after hours)

## 2013-08-04 DIAGNOSIS — I4892 Unspecified atrial flutter: Secondary | ICD-10-CM

## 2013-08-04 LAB — COMPREHENSIVE METABOLIC PANEL
ALT: 32 U/L (ref 0–53)
ANION GAP: 16 — AB (ref 5–15)
AST: 69 U/L — ABNORMAL HIGH (ref 0–37)
Albumin: 2.6 g/dL — ABNORMAL LOW (ref 3.5–5.2)
Alkaline Phosphatase: 55 U/L (ref 39–117)
BUN: 42 mg/dL — ABNORMAL HIGH (ref 6–23)
CALCIUM: 8.6 mg/dL (ref 8.4–10.5)
CO2: 22 meq/L (ref 19–32)
CREATININE: 1.59 mg/dL — AB (ref 0.50–1.35)
Chloride: 103 mEq/L (ref 96–112)
GFR, EST AFRICAN AMERICAN: 47 mL/min — AB (ref >90)
GFR, EST NON AFRICAN AMERICAN: 41 mL/min — AB (ref >90)
Glucose, Bld: 100 mg/dL — ABNORMAL HIGH (ref 70–99)
Potassium: 3.8 mEq/L (ref 3.7–5.3)
Sodium: 141 mEq/L (ref 137–147)
Total Bilirubin: 0.9 mg/dL (ref 0.3–1.2)
Total Protein: 6.6 g/dL (ref 6.0–8.3)

## 2013-08-04 LAB — TYPE AND SCREEN
ABO/RH(D): O POS
Antibody Screen: NEGATIVE
Unit division: 0

## 2013-08-04 LAB — CBC
HCT: 25.7 % — ABNORMAL LOW (ref 39.0–52.0)
Hemoglobin: 8.7 g/dL — ABNORMAL LOW (ref 13.0–17.0)
MCH: 29.5 pg (ref 26.0–34.0)
MCHC: 33.9 g/dL (ref 30.0–36.0)
MCV: 87.1 fL (ref 78.0–100.0)
PLATELETS: 276 10*3/uL (ref 150–400)
RBC: 2.95 MIL/uL — ABNORMAL LOW (ref 4.22–5.81)
RDW: 14.2 % (ref 11.5–15.5)
WBC: 9.4 10*3/uL (ref 4.0–10.5)

## 2013-08-04 LAB — MRSA PCR SCREENING: MRSA BY PCR: NEGATIVE

## 2013-08-04 MED ORDER — DSS 100 MG PO CAPS: 100.0000 mg | ORAL_CAPSULE | Freq: Two times a day (BID) | ORAL | Status: AC

## 2013-08-04 MED ORDER — DEXTROSE 5 % IV SOLN
5.0000 mg/h | INTRAVENOUS | Status: DC
  Filled 2013-08-04: qty 100

## 2013-08-04 MED ORDER — AMOXICILLIN-POT CLAVULANATE 875-125 MG PO TABS: 1.0000 | ORAL_TABLET | Freq: Two times a day (BID) | ORAL | Status: DC

## 2013-08-04 MED ORDER — SENNA 8.6 MG PO TABS: 2.0000 | ORAL_TABLET | Freq: Two times a day (BID) | ORAL | Status: AC

## 2013-08-04 MED ORDER — DILTIAZEM HCL 25 MG/5ML IV SOLN
10.0000 mg | Freq: Once | INTRAVENOUS | Status: AC
  Administered 2013-08-04: 10 mg via INTRAVENOUS
  Filled 2013-08-04 (×2): qty 5

## 2013-08-04 MED ORDER — DEXTROSE 5 % IV SOLN
5.0000 mg/h | INTRAVENOUS | Status: DC
  Administered 2013-08-04: 10 mg/h via INTRAVENOUS
  Administered 2013-08-05 (×3): 20 mg/h via INTRAVENOUS
  Filled 2013-08-04: qty 100

## 2013-08-04 MED ORDER — METOPROLOL TARTRATE 1 MG/ML IV SOLN
5.0000 mg | INTRAVENOUS | Status: AC | PRN
  Administered 2013-08-04 – 2013-08-07 (×2): 5 mg via INTRAVENOUS
  Filled 2013-08-04: qty 5

## 2013-08-04 MED ORDER — METOPROLOL TARTRATE 1 MG/ML IV SOLN
INTRAVENOUS | Status: AC
  Administered 2013-08-04: 09:00:00
  Filled 2013-08-04: qty 5

## 2013-08-04 MED ORDER — SODIUM CHLORIDE 0.9 % IV BOLUS (SEPSIS)
250.0000 mL | Freq: Once | INTRAVENOUS | Status: AC
  Administered 2013-08-04: 250 mL via INTRAVENOUS

## 2013-08-04 NOTE — Progress Notes (Signed)
Report called to [facility] on [place]. Vitals at this time 115/70 P 123. Family at the bedside. Pt going to [place] bed 12. Cardizem drip ordered and will take to 3 south to be admin. Notified Dr Allyson Sabal of Pts transfer and Dr Doran Durand (ortho) has signed off.

## 2013-08-04 NOTE — Progress Notes (Signed)
Paged Dr. Mare Ferrari for order clarification on cardizem gtt. Received TORB for cardizem gtt at 10mg /hr and titrate by 5mg /hr Q30 mins to maintain HR of 65-105.

## 2013-08-04 NOTE — Progress Notes (Signed)
SLP Cancellation Note  Patient Details Name: Walter Carey MRN: [id number] DOB: Mar 28, 1934   Cancelled treatment:        Per RN, pt not ready to try advanced consistencies, and is receiving risperdal at this time for agitation. ST to follow up 8/3 for readiness to advance to dys 2.   Bently Morath B. Quentin Ore [facility], CCC-SLP [phone]  Shonna Chock 08/04/2013, 11:23 AM

## 2013-08-04 NOTE — Significant Event (Signed)
Rapid Response Event Note  Overview: Time Called: 1200 Arrival Time: 1205 Event Type: Cardiac  Initial Focused Assessment:  Called by primary RN to assist with administering cardizem push for patient in A flutter 150's.  Upon my arrival to patients room, family at bedside.  Patient sitting up in bed, denies any pain or SOB.  Patient has some audible expiratory wheeze    Interventions:  10 mg IV cardizem given as per MD order.  BP 103/73 HR 150's on monitor, Patient RR 26 prior to cardizem administration.  Five minutes after administration 101/72, HR 120's, 10 minutes after administration 1225, 114/69, HR 116 a flutter.     Event Summary:  RN to call if assistance needed   at      at          [facility], Harlin Rain

## 2013-08-04 NOTE — Progress Notes (Signed)
PT Cancellation Note  Patient Details Name: Walter Carey MRN: [id number] DOB: [birth year]   Cancelled Treatment:    Reason Eval/Treat Not Completed: Medical issues which prohibited therapy.  Pt tachy and MD ordering EKG.  Will hold PT at this time.     Liv Rallis, Thornton Papas 08/04/2013, 9:41 AM

## 2013-08-04 NOTE — Progress Notes (Signed)
Subjective: 4 Days Post-Op Procedure(s) (LRB): INTRAMEDULLARY (IM) NAIL INTERTROCHANTRIC (Right) Patient less agitated.  Cards consult pending for a flutter.  Pt being transferred to stepdown.    Objective: Vital signs in last 24 hours: Temp:  [98.3 F (36.8 C)-99.1 F (37.3 C)] 98.6 F (37 C) (07/30 1424) Pulse Rate:  [45-160] 109 (07/30 1424) Resp:  [16-18] 18 (07/30 1600) BP: (100-130)/(65-88) 119/65 mmHg (07/30 1424) SpO2:  [93 %-98 %] 98 % (07/30 1424) Weight:  [77 kg (169 lb 12.1 oz)] 77 kg (169 lb 12.1 oz) (07/30 0428)  Intake/Output from previous day: 07/29 0701 - 07/30 0700 In: 407.5 [P.O.:25; I.V.:20; Blood:362.5] Out: -  Intake/Output this shift: Total I/O In: 240 [P.O.:240] Out: -    Recent Labs  08/02/13 0432 08/03/13 0440 08/04/13 0638  HGB 7.5* 7.2* 8.7*    Recent Labs  08/03/13 0440 08/04/13 0638  WBC 11.0* 9.4  RBC 2.34* 2.95*  HCT 21.2* 25.7*  PLT 233 276    Recent Labs  08/02/13 0432 08/04/13 0638  NA 138 141  K 4.1 3.8  CL 102 103  CO2 22 22  BUN 44* 42*  CREATININE 2.07* 1.59*  GLUCOSE 161* 100*  CALCIUM 8.6 8.6   No results found for this basename: LABPT, INR,  in the last 72 hours  PE:  r hip wounds dressed and dry.  Assessment/Plan: 4 Days Post-Op Procedure(s) (LRB): INTRAMEDULLARY (IM) NAIL INTERTROCHANTRIC (Right) Discharge to SNF when stable per medicine and cards.  DVT prophylaxis and pain med rxs written in epic.  F/u in chart.  Ortho signing off.  Pls call [phone] with questions.  [name] 08/04/2013, 4:51 PM

## 2013-08-04 NOTE — Progress Notes (Signed)
Patient EH:UDJSH D Agrawal      DOB: August 23, 1934      FWY:[id number]  Present this am when patient was noted to be tachycardic.  Dr. Allyson Sabal working up the condition.  Reviewed the notes and talked with patient's daughter.  Today is the best that Mr. Kinne has looked, in spite of the new aflutter! Helene Kelp happy with level of care and was able to locate SNF for him when he is medically stable.  Goals of care at present are DNR but treat the treatable.  PMT will shadow to discharge . Daughter knows how to reach our cell phone if she wants to make changes to plan.  Palliative Care Services is available at [facility] if the family would like a consult.   Corinne Goucher L. Lovena Le, MD MBA The Palliative Medicine Team at [facility] Phone: [phone] Pager: [phone] ( Use team phone after hours)

## 2013-08-04 NOTE — Progress Notes (Signed)
[facility] PROGRESS NOTE  Wolfe Camarena Buchberger IZT:[id number] DOB: 1934/01/23 DOA: 07/31/2013 PCP: Marjorie Smolder, MD  Assessment/Plan: Principal Problem:   Closed intertrochanteric fracture of hip Active Problems:   Dementia   Falls   Normal pressure hydrocephalus   Agitation   Hip fracture    Assessment/Plan:  Atrial Flutter  New onset. On PO cardizem 240cd. SP 10mg  IV dilt with improvement. Will start on IV cardizem so we can titrate and monitor BP at the same time. If he gets hypotensive, we can consider amiodarone.  Transferred to step down  Closed intertrochanteric fracture of hip  - S/p ORIF on 7.26.2015.  - Avoid narcotics use haldol for agitation.  - Pt consult pending. Meeting with PMT on 7.27.2015.  - Tylenol for pain, home in 24 hrs. Consult SW for SNF.  - Awaiting SNF placement.   Unknown source Fever:  - CRX yesterday was negative, U/a. , Repeat chest x-ray today  Most likely secondary to aspiration, continue vancomycin and cefepime empirically  - Pt on lovenox, tylenol for fever.  - Dys 1 diet, start vanc and cefepime  Follow blood culture   Acute blood loss anemia  Hemoglobin drop from 11.6 and 7.2 , now improving  Most likely lost blood subsequent to the fall, he has a big bruise in his right flank  Transfuse 1 unit of packed red blood cells, follow CBC  Fall:  - Frequent falls due to NPH, dementia; cognitive impairment, gait disturbance, and urinary incontinence  - s/p VP shunt, likely not helpful since progressive disease;  - hold amitriptyline.   HTN:  - stable; cont home regimen   Probable UTI, leukocytosis;  - ? Treated at SNF:  - start empiric atx, negative cultures, will d/c antibiotics  .  CKD:  - stable cont monitoring  Code Status: DNR  Family Communication: d/w patient, family, Versie Starks [place] , [place]  Disposition Plan: Transferred to step down for Cardizem drip  Consultants:  Ortho Procedures:  S/p  Orthopedic Antibiotics:  none HPI/Subjective:  Non verbal. Tachycardic   Objective: Filed Vitals:   08/04/13 0712 08/04/13 0800 08/04/13 0920 08/04/13 0932  BP: 107/68  109/88 100/87  Pulse: 45  160 145  Temp: 99.1 F (37.3 C)     TempSrc: Oral     Resp: 16 18 18 18   Height:      Weight:      SpO2: 97%  98% 98%    Intake/Output Summary (Last 24 hours) at 08/04/13 1336 Last data filed at 08/04/13 0850  Gross per 24 hour  Intake    610 ml  Output      0 ml  Net    610 ml    Exam:  General: in no acute distress.  HEENT: No bruits, no goiter.  Heart: Regular rate and rhythm.  Lungs: Good air movement, clear  Abdomen: Soft, nontender, nondistended, positive bowel sounds.  Neuro: Grossly intact, nonfocal    Data Reviewed: Basic Metabolic Panel:  Recent Labs Lab 07/31/13 1000 08/01/13 0650 08/02/13 0432 08/04/13 0638  NA 137 137 138 141  K 3.8 4.5 4.1 3.8  CL 99 98 102 103  CO2 23 23 22 22   GLUCOSE 164* 115* 161* 100*  BUN 28* 40* 44* 42*  CREATININE 1.83* 2.05* 2.07* 1.59*  CALCIUM 9.0 8.8 8.6 8.6    Liver Function Tests:  Recent Labs Lab 07/31/13 1000 08/01/13 0650 08/04/13 0638  AST 15 73* 69*  ALT 10 17 32  ALKPHOS 74 60 55  BILITOT 0.4 0.6 0.9  PROT 7.0 6.5 6.6  ALBUMIN 3.4* 3.1* 2.6*   No results found for this basename: LIPASE, AMYLASE,  in the last 168 hours No results found for this basename: AMMONIA,  in the last 168 hours  CBC:  Recent Labs Lab 07/31/13 1000 08/01/13 0650 08/02/13 0432 08/03/13 0440 08/04/13 0638  WBC 14.1* 13.3* 13.3* 11.0* 9.4  NEUTROABS 12.4*  --   --   --   --   HGB 11.4* 8.5* 7.5* 7.2* 8.7*  HCT 32.7* 25.6* 21.2* 21.2* 25.7*  MCV 88.1 91.8 88.7 90.6 87.1  PLT 304 257 241 233 276    Cardiac Enzymes: No results found for this basename: CKTOTAL, CKMB, CKMBINDEX, TROPONINI,  in the last 168 hours BNP (last 3 results) No results found for this basename: PROBNP,  in the last 8760 hours   CBG: No  results found for this basename: GLUCAP,  in the last 168 hours  Recent Results (from the past 240 hour(s))  URINE CULTURE     Status: None   Collection Time    07/31/13 10:21 PM      Result Value Ref Range Status   Specimen Description URINE, RANDOM   Final   Special Requests NONE   Final   Culture  Setup Time     Final   Value: 08/01/2013 10:02     Performed at [facility] Count     Final   Value: NO GROWTH     Performed at [facility]   Culture     Final   Value: NO GROWTH     Performed at [facility]   Report Status 08/02/2013 FINAL   Final  CULTURE, BLOOD (ROUTINE X 2)     Status: None   Collection Time    08/02/13  3:00 PM      Result Value Ref Range Status   Specimen Description BLOOD RIGHT ARM   Final   Special Requests BOTTLES DRAWN AEROBIC AND ANAEROBIC 10CC   Final   Culture  Setup Time     Final   Value: 08/02/2013 19:06     Performed at [facility]   Culture     Final   Value:        BLOOD CULTURE RECEIVED NO GROWTH TO DATE CULTURE WILL BE HELD FOR 5 DAYS BEFORE ISSUING A FINAL NEGATIVE REPORT     Performed at [facility]   Report Status PENDING   Incomplete  CULTURE, BLOOD (ROUTINE X 2)     Status: None   Collection Time    08/02/13  3:15 PM      Result Value Ref Range Status   Specimen Description BLOOD LEFT HAND   Final   Special Requests BOTTLES DRAWN AEROBIC AND ANAEROBIC 10CC   Final   Culture  Setup Time     Final   Value: 08/02/2013 19:07     Performed at [facility]   Culture     Final   Value:        BLOOD CULTURE RECEIVED NO GROWTH TO DATE CULTURE WILL BE HELD FOR 5 DAYS BEFORE ISSUING A FINAL NEGATIVE REPORT     Performed at [facility]   Report Status PENDING   Incomplete     Studies: Dg Chest 1 View  07/31/2013   CLINICAL DATA:  fall, hip pain  EXAM: CHEST - 1 VIEW  COMPARISON:  07/10/2012  FINDINGS: VP shunt tubing partially seen over the right hemi thorax. Lungs clear.  The mild edema suspected on previous study has resolved. Heart size normal. No effusion. Regional bones unremarkable.  IMPRESSION: 1. No acute disease   Electronically Signed   By: Arne Cleveland M.D.   On: 07/31/2013 09:42   Dg Hip Complete Right  07/31/2013   CLINICAL DATA:  HIP PAIN FALL  EXAM: RIGHT HIP - COMPLETE 2+ VIEW  COMPARISON:  07/21/2003  FINDINGS: There is a comminuted intertrochanteric fracture of the right femur. There are several cm displacement and foreshortening of major fracture fragments. The lesser trochanter is a separate fracture fragment. Femoral head remains located.  IMPRESSION: 1. Comminuted displaced right intertrochanteric femur fracture.   Electronically Signed   By: Arne Cleveland M.D.   On: 07/31/2013 09:40   Dg Femur Right  07/31/2013   CLINICAL DATA:  79 year old male -ORIF right femur fracture.  EXAM: RIGHT FEMUR - 2 VIEW; DG C-ARM 61-120 MIN  COMPARISON:  07/31/2013  FINDINGS: Intraoperative spot films of the right femur are submitted postoperatively for interpretation.  Nail and screw fixation of an intertrochanteric right femur fracture identified. The fracture is in near-anatomic alignment and position.  No complicating features are identified.  IMPRESSION: Internal fixation of intertrochanteric right femur fracture.   Electronically Signed   By: Hassan Rowan M.D.   On: 07/31/2013 17:35   Dg Femur Right  07/31/2013   CLINICAL DATA:  HIP PAIN FALL  EXAM: RIGHT FEMUR - 2 VIEW  COMPARISON:  07/21/2003  FINDINGS: Comminuted intertrochanteric fracture of the right femur. Several cm of displacement and override of major fracture fragments. The lesser trochanter is a separate fracture fragment. The femoral head remains seated. Distal femur is intact. Patchy femoral-popliteal arterial calcifications.  IMPRESSION: 1. Comminuted displaced intertrochanteric fracture   Electronically Signed   By: Arne Cleveland M.D.   On: 07/31/2013 09:41   Ct Head Wo Contrast  07/31/2013    CLINICAL DATA:  Fall ; dementia  EXAM: CT HEAD WITHOUT CONTRAST  CT CERVICAL SPINE WITHOUT CONTRAST  COMPARISON:  Brain CT July 04, 2013  TECHNIQUE: Multidetector CT imaging of the head and cervical spine was performed following the standard protocol without intravenous contrast. Multiplanar CT image reconstructions of the cervical spine were also generated.  FINDINGS: CT HEAD: There is diffuse ventricular enlargement with less pronounced sulcal enlargement, stable. There is a shunt catheter with the tip in the right lateral ventricle, stable. There is no intracranial mass, hemorrhage, extra-axial fluid collection or midline shift. There is decreased attenuation surrounding the lateral ventricles, stable. There are small lacunar type infarcts in the lateral left lentiform nucleus as well as in both external capsules, stable. No new gray-white compartment lesion. No acute infarct.  Bony calvarium appears intact except for a shunt catheter placement defect in the anterior right parietal bone. Mastoid air cells are clear. There is ethmoid sinus disease bilaterally.  CT CERVICAL SPINE: There is no fracture or spondylolisthesis. Prevertebral soft tissues and predental space regions are normal. There is moderate disc space narrowing at C4-5, C5-6, and C6-7. There is bony hypertrophy at most levels with multiple areas of as exit foraminal narrowing due to facet hypertrophy. No disc extrusion or stenosis. There is calcification in both carotid arteries.  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 Stable ventricular enlargement with shunt catheter in place, stable from prior study. Decreased attenuation adjacent to the ventricles is most likely  due to a combination of small vessel disease and interstitial edema from the chronic ventricular enlargement. No acute infarct apparent. No hemorrhage or mass effect. There is ethmoid sinus disease bilaterally.  CT cervical spine:  Multilevel spondylosis and osteoarthritic change. No fracture or spondylolisthesis. Calcification in both carotid arteries.   Electronically Signed   By: Lowella Grip M.D.   On: 07/31/2013 10:02   Ct Cervical Spine Wo Contrast  07/31/2013   CLINICAL DATA:  Fall ; dementia  EXAM: CT HEAD WITHOUT CONTRAST  CT CERVICAL SPINE WITHOUT CONTRAST  COMPARISON:  Brain CT July 04, 2013  TECHNIQUE: Multidetector CT imaging of the head and cervical spine was performed following the standard protocol without intravenous contrast. Multiplanar CT image reconstructions of the cervical spine were also generated.  FINDINGS: CT HEAD: There is diffuse ventricular enlargement with less pronounced sulcal enlargement, stable. There is a shunt catheter with the tip in the right lateral ventricle, stable. There is no intracranial mass, hemorrhage, extra-axial fluid collection or midline shift. There is decreased attenuation surrounding the lateral ventricles, stable. There are small lacunar type infarcts in the lateral left lentiform nucleus as well as in both external capsules, stable. No new gray-white compartment lesion. No acute infarct.  Bony calvarium appears intact except for a shunt catheter placement defect in the anterior right parietal bone. Mastoid air cells are clear. There is ethmoid sinus disease bilaterally.  CT CERVICAL SPINE: There is no fracture or spondylolisthesis. Prevertebral soft tissues and predental space regions are normal. There is moderate disc space narrowing at C4-5, C5-6, and C6-7. There is bony hypertrophy at most levels with multiple areas of as exit foraminal narrowing due to facet hypertrophy. No disc extrusion or stenosis. There is calcification in both carotid arteries.  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 Stable ventricular enlargement with shunt catheter in place, stable from prior study. Decreased attenuation adjacent to the ventricles  is most likely due to a combination of small vessel disease and interstitial edema from the chronic ventricular enlargement. No acute infarct apparent. No hemorrhage or mass effect. There is ethmoid sinus disease bilaterally.  CT cervical spine: Multilevel spondylosis and osteoarthritic change. No fracture or spondylolisthesis. Calcification in both carotid arteries.   Electronically Signed   By: Lowella Grip M.D.   On: 07/31/2013 10:02   Dg Chest Port 1 View  08/03/2013   CLINICAL DATA:  Fever, pneumonia, postop  EXAM: PORTABLE CHEST - 1 VIEW  COMPARISON:  07/25/2013  FINDINGS: Cardiomediastinal silhouette is stable. Right VP shunt catheter is unchanged in position. No segmental infiltrate or pulmonary edema. Central mild vascular congestion.  IMPRESSION: No segmental infiltrate or pulmonary edema. Central mild vascular congestion.   Electronically Signed   By: Lahoma Crocker M.D.   On: 08/03/2013 11:38   Dg Chest Port 1 View  08/02/2013   CLINICAL DATA:  Fever.  EXAM: PORTABLE CHEST - 1 VIEW  COMPARISON:  Chest radiograph 07/31/2013.  FINDINGS: Patient is rotated. Stable enlarged cardiac and mediastinal contours. Low lung volumes. No consolidative pulmonary opacities. No pleural effusion or pneumothorax. Showing catheter overlies the right hemi thorax.  IMPRESSION: No acute cardiopulmonary process.   Electronically Signed   By: Lovey Newcomer M.D.   On: 08/02/2013 18:26   Dg C-arm 1-60 Min  07/31/2013   CLINICAL DATA:  79 year old male -ORIF right femur fracture.  EXAM: RIGHT FEMUR - 2 VIEW; DG C-ARM 61-120 MIN  COMPARISON:  07/31/2013  FINDINGS: Intraoperative spot films of  the right femur are submitted postoperatively for interpretation.  Nail and screw fixation of an intertrochanteric right femur fracture identified. The fracture is in near-anatomic alignment and position.  No complicating features are identified.  IMPRESSION: Internal fixation of intertrochanteric right femur fracture.   Electronically  Signed   By: Hassan Rowan M.D.   On: 07/31/2013 17:35    Scheduled Meds: . ceFEPime (MAXIPIME) IV  1 g Intravenous Q12H  . docusate sodium  100 mg Oral BID  . enoxaparin (LOVENOX) injection  40 mg Subcutaneous Q24H  . levothyroxine  150 mcg Oral QAC breakfast  . pantoprazole  40 mg Oral Daily  . senna  2 tablet Oral BID  . sertraline  100 mg Oral Daily  . vancomycin  750 mg Intravenous Q24H   Continuous Infusions: . sodium chloride    . sodium chloride 75 mL/hr at 08/02/13 1543  . diltiazem (CARDIZEM) infusion      Principal Problem:   Closed intertrochanteric fracture of hip Active Problems:   Dementia   Falls   Normal pressure hydrocephalus   Agitation   Hip fracture    Time spent: 40 minutes   [facility] Pager [phone]. If 8PM-8AM, please contact night-coverage at [email or web address], password [facility] 08/04/2013, 1:36 PM  LOS: 4 days

## 2013-08-04 NOTE — Consult Note (Signed)
Reason for Consult: Atrial Flutter Referring Physician:   [name] is an 79 y.o. male.  HPI:   The patient is a 79 yo male with a history of HTN, dementia, AVNRT, CKD III, dyslipidemia, hypothyroidism, hydrocephalus with shunt, high fall risk, prostate cancer.   Echocardiogram from July 2014 showed an EF of 50-55%, normal wall motion, mild MR.  He presents after sustaining a right hip fracture.  He went to the OR on 7/26 for open treatment with intramedullary nailing.  We are asked to see for tachycardia.  He is atrial flutter with rapid V rate.   The patient reports everything is fine.  The patient currently denies nausea, vomiting, fever, chest pain, shortness of breath, orthopnea, dizziness, PND, cough, congestion, abdominal pain.  HR up to the 160's.  90m of IV cardizem given and he is responding.  HR down to 120's.    Past Medical History  Diagnosis Date  . Hypertension   . Dementia   . Hydrocephalus with operating shunt   . Neuropathy   . AVNRT (AV nodal re-entry tachycardia)   . Falls   . CKD (chronic kidney disease), stage III   . Normal pressure hydrocephalus 12/24/2012  . Abnormality of gait 12/24/2012  . Dyslipidemia   . Hypothyroidism   . Personal history of subdural hematoma     Bilateral, following VP shunt placement  . Migraine headache   . Cancer     Prostate cancer  . Memory loss     Past Surgical History  Procedure Laterality Date  . Carotid artery angioplasty    . Cystostomy w/ bladder biopsy    . Ventriculoperitoneal shunt      PROGRAMMABLE  . Cataract extraction Bilateral   . Shoulder surgery Left     Bone spur  . Carotid endarterectomy      Right  . Intramedullary (im) nail intertrochanteric Right 07/31/2013    Procedure: INTRAMEDULLARY (IM) NAIL INTERTROCHANTRIC;  Surgeon: Walter Simmer MD;  Location: [facility]  Service: Orthopedics;  Laterality: Right;    Family History  Problem Relation Age of Onset  . Stroke Mother   . Diabetes Father       Social History:  reports that he has been smoking.  He has never used smokeless tobacco. He reports that he does not drink alcohol or use illicit drugs.  Allergies:  Allergies  Allergen Reactions  . Asa [Aspirin]   . Erythromycin   . Morphine And Related   . Oxycodone   . Stadol [Butorphanol]   . Tylox [Oxycodone-Acetaminophen]   . Versed [Midazolam]     Medications:  Scheduled Meds: . ceFEPime (MAXIPIME) IV  1 g Intravenous Q12H  . diltiazem  240 mg Oral Daily  . docusate sodium  100 mg Oral BID  . enoxaparin (LOVENOX) injection  40 mg Subcutaneous Q24H  . levothyroxine  150 mcg Oral QAC breakfast  . pantoprazole  40 mg Oral Daily  . senna  2 tablet Oral BID  . sertraline  100 mg Oral Daily  . vancomycin  750 mg Intravenous Q24H   Continuous Infusions: . sodium chloride    . sodium chloride 75 mL/hr at 08/02/13 1543   PRN Meds:.acetaminophen, acetaminophen, haloperidol lactate, menthol-cetylpyridinium, metoprolol, ondansetron (ZOFRAN) IV, ondansetron, phenol, risperiDONE   Results for orders placed during the hospital encounter of 07/31/13 (from the past 48 hour(s))  CULTURE, BLOOD (ROUTINE X 2)     Status: None   Collection Time  08/02/13  3:00 PM      Result Value Ref Range   Specimen Description BLOOD RIGHT ARM     Special Requests BOTTLES DRAWN AEROBIC AND ANAEROBIC 10CC     Culture  Setup Time       Value: 08/02/2013 19:06     Performed at [facility]   Culture       Value:        BLOOD CULTURE RECEIVED NO GROWTH TO DATE CULTURE WILL BE HELD FOR 5 DAYS BEFORE ISSUING A FINAL NEGATIVE REPORT     Performed at [facility]   Report Status PENDING    CULTURE, BLOOD (ROUTINE X 2)     Status: None   Collection Time    08/02/13  3:15 PM      Result Value Ref Range   Specimen Description BLOOD LEFT HAND     Special Requests BOTTLES DRAWN AEROBIC AND ANAEROBIC 10CC     Culture  Setup Time       Value: 08/02/2013 19:07     Performed at  [facility]   Culture       Value:        BLOOD CULTURE RECEIVED NO GROWTH TO DATE CULTURE WILL BE HELD FOR 5 DAYS BEFORE ISSUING A FINAL NEGATIVE REPORT     Performed at [facility]   Report Status PENDING    URINALYSIS, ROUTINE W REFLEX MICROSCOPIC     Status: Abnormal   Collection Time    08/02/13 10:40 PM      Result Value Ref Range   Color, Urine YELLOW  YELLOW   APPearance CLEAR  CLEAR   Specific Gravity, Urine 1.021  1.005 - 1.030   pH 6.5  5.0 - 8.0   Glucose, UA NEGATIVE  NEGATIVE mg/dL   Hgb urine dipstick LARGE (*) NEGATIVE   Bilirubin Urine NEGATIVE  NEGATIVE   Ketones, ur NEGATIVE  NEGATIVE mg/dL   Protein, ur 100 (*) NEGATIVE mg/dL   Urobilinogen, UA 1.0  0.0 - 1.0 mg/dL   Nitrite NEGATIVE  NEGATIVE   Leukocytes, UA SMALL (*) NEGATIVE  URINE MICROSCOPIC-ADD ON     Status: Abnormal   Collection Time    08/02/13 10:40 PM      Result Value Ref Range   Squamous Epithelial / LPF RARE  RARE   WBC, UA 3-6  <3 WBC/hpf   RBC / HPF 3-6  <3 RBC/hpf   Bacteria, UA RARE  RARE   Casts GRANULAR CAST (*) NEGATIVE  CBC     Status: Abnormal   Collection Time    08/03/13  4:40 AM      Result Value Ref Range   WBC 11.0 (*) 4.0 - 10.5 K/uL   RBC 2.34 (*) 4.22 - 5.81 MIL/uL   Hemoglobin 7.2 (*) 13.0 - 17.0 g/dL   HCT 21.2 (*) 39.0 - 52.0 %   MCV 90.6  78.0 - 100.0 fL   MCH 30.8  26.0 - 34.0 pg   MCHC 34.0  30.0 - 36.0 g/dL   RDW 13.1  11.5 - 15.5 %   Platelets 233  150 - 400 K/uL  PREPARE RBC (CROSSMATCH)     Status: None   Collection Time    08/03/13  8:35 AM      Result Value Ref Range   Order Confirmation ORDER PROCESSED BY BLOOD BANK    TYPE AND SCREEN     Status: None   Collection Time  08/03/13  8:35 AM      Result Value Ref Range   ABO/RH(D) O POS     Antibody Screen NEG     Sample Expiration 08/06/2013     Unit Number [id number]     Blood Component Type RBC CPDA1, LR     Unit division 00     Status of Unit ISSUED,FINAL     Transfusion  Status OK TO TRANSFUSE     Crossmatch Result Compatible    ABO/RH     Status: None   Collection Time    08/03/13  8:35 AM      Result Value Ref Range   ABO/RH(D) O POS    CBC     Status: Abnormal   Collection Time    08/04/13  6:38 AM      Result Value Ref Range   WBC 9.4  4.0 - 10.5 K/uL   RBC 2.95 (*) 4.22 - 5.81 MIL/uL   Hemoglobin 8.7 (*) 13.0 - 17.0 g/dL   Comment: DELTA CHECK NOTED     POST TRANSFUSION SPECIMEN   HCT 25.7 (*) 39.0 - 52.0 %   MCV 87.1  78.0 - 100.0 fL   MCH 29.5  26.0 - 34.0 pg   MCHC 33.9  30.0 - 36.0 g/dL   RDW 14.2  11.5 - 15.5 %   Platelets 276  150 - 400 K/uL  COMPREHENSIVE METABOLIC PANEL     Status: Abnormal   Collection Time    08/04/13  6:38 AM      Result Value Ref Range   Sodium 141  137 - 147 mEq/L   Potassium 3.8  3.7 - 5.3 mEq/L   Chloride 103  96 - 112 mEq/L   CO2 22  19 - 32 mEq/L   Glucose, Bld 100 (*) 70 - 99 mg/dL   BUN 42 (*) 6 - 23 mg/dL   Creatinine, Ser 1.59 (*) 0.50 - 1.35 mg/dL   Calcium 8.6  8.4 - 10.5 mg/dL   Total Protein 6.6  6.0 - 8.3 g/dL   Albumin 2.6 (*) 3.5 - 5.2 g/dL   AST 69 (*) 0 - 37 U/L   ALT 32  0 - 53 U/L   Alkaline Phosphatase 55  39 - 117 U/L   Total Bilirubin 0.9  0.3 - 1.2 mg/dL   GFR calc non Af Amer 41 (*) >90 mL/min   GFR calc Af Amer 47 (*) >90 mL/min   Comment: (NOTE)     The eGFR has been calculated using the CKD EPI equation.     This calculation has not been validated in all clinical situations.     eGFR's persistently <90 mL/min signify possible Chronic Kidney     Disease.   Anion gap 16 (*) 5 - 15    Dg Chest Port 1 View  08/03/2013   CLINICAL DATA:  Fever, pneumonia, postop  EXAM: PORTABLE CHEST - 1 VIEW  COMPARISON:  07/25/2013  FINDINGS: Cardiomediastinal silhouette is stable. Right VP shunt catheter is unchanged in position. No segmental infiltrate or pulmonary edema. Central mild vascular congestion.  IMPRESSION: No segmental infiltrate or pulmonary edema. Central mild vascular  congestion.   Electronically Signed   By: Lahoma Crocker M.D.   On: 08/03/2013 11:38   Dg Chest Port 1 View  08/02/2013   CLINICAL DATA:  Fever.  EXAM: PORTABLE CHEST - 1 VIEW  COMPARISON:  Chest radiograph 07/31/2013.  FINDINGS: Patient is rotated. Stable enlarged cardiac and mediastinal  contours. Low lung volumes. No consolidative pulmonary opacities. No pleural effusion or pneumothorax. Showing catheter overlies the right hemi thorax.  IMPRESSION: No acute cardiopulmonary process.   Electronically Signed   By: Lovey Newcomer M.D.   On: 08/02/2013 18:26    Review of Systems  Unable to perform ROS: dementia   Blood pressure 100/87, pulse 145, temperature 99.1 F (37.3 C), temperature source Oral, resp. rate 18, height 5' 8.11" (1.73 m), weight 169 lb 12.1 oz (77 kg), SpO2 98.00%. Physical Exam  Nursing note and vitals reviewed. Constitutional: He is oriented to person, place, and time. He appears well-developed and well-nourished. No distress.  HENT:  Head: Normocephalic and atraumatic.  Eyes: EOM are normal. Pupils are equal, round, and reactive to light. No scleral icterus.  Neck: Normal range of motion. Neck supple.  Cardiovascular: S1 normal and S2 normal.  An irregularly irregular rhythm present. Tachycardia present.   No murmur heard. Pulses:      Radial pulses are 1+ on the right side, and 1+ on the left side.       Dorsalis pedis pulses are 1+ on the right side, and 1+ on the left side.       Posterior tibial pulses are 0 on the right side, and 0 on the left side.  Respiratory: Effort normal. He has wheezes. He has rales (Mild crackles at the right base).  GI: Soft. Bowel sounds are normal. He exhibits no distension. There is no tenderness.  Musculoskeletal: He exhibits no edema.  Neurological: He is alert and oriented to person, place, and time. He exhibits normal muscle tone.  Skin: Skin is warm and dry.  Psychiatric: He has a normal mood and affect.    Assessment/Plan: Active  Problems:   Closed intertrochanteric fracture of hip  POD# 4 Open treatment of right hip intertrochanteric fracture with intramedullary nailing.    Atrial Flutter New onset.  On PO cardizem 240cd.  SP 64m IV dilt with improvement.  Will start on IV cardizem so we can titrate and monitor BP at the same time.  If he gets hypotensive, we can consider amiodarone.  It was probably the stress of surgery and drop in HGB from 11.4 to a low of 7.2.  Currently 8.7 after one unit PRBCs.   He is not an anticoagulation candidate.  Will need to transfer to stepdown.       Hx of AVNRT   Dementia   Falls   Normal pressure hydrocephalus   Agitation   Hip fracture   Anemia     Walter Carey, BRYAN, PA-C 08/04/2013, 12:16 PM   Patient was seen on [facility] with Walter Fuller PA-C.  Agree with his assessment and plan as noted above. The patient has no prior history of atrial flutter or fibrillation but does have a past history of AVNRT treated by Dr. NJohnsie Cancel He has done well on on oral diltiazem at home. Suspect the stress of surgery and anemia post-op was enough to trigger his new onset of atrial flutter. The patient denies any symptoms of chest discomfort or dyspnea related to his atrial flutter. However he does have severe dementia and his history is unreliable. Will plan to move to higher level of care so that diltiazem can be titrated. Continue to follow hemoglobin closely.

## 2013-08-05 ENCOUNTER — Encounter: Payer: Self-pay | Admitting: Adult Health

## 2013-08-05 DIAGNOSIS — F0390 Unspecified dementia without behavioral disturbance: Secondary | ICD-10-CM

## 2013-08-05 DIAGNOSIS — I498 Other specified cardiac arrhythmias: Secondary | ICD-10-CM

## 2013-08-05 DIAGNOSIS — R269 Unspecified abnormalities of gait and mobility: Secondary | ICD-10-CM

## 2013-08-05 LAB — T4, FREE: FREE T4: 0.92 ng/dL (ref 0.80–1.80)

## 2013-08-05 MED ORDER — DILTIAZEM HCL ER COATED BEADS 360 MG PO CP24
360.0000 mg | ORAL_CAPSULE | Freq: Every day | ORAL | Status: DC
  Administered 2013-08-05 – 2013-08-08 (×4): 360 mg via ORAL
  Filled 2013-08-05 (×4): qty 1

## 2013-08-05 NOTE — Progress Notes (Signed)
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 Denies CP, SOB, or any other acute complaints.   Objective: Telemetry: brief conversion to NSR then back to flutter, then conversion to NSR with what appears to be brief atrial fib as well as WAP  Physical Exam: Blood pressure 104/62, pulse 56, temperature 98.2 F (36.8 C), temperature source Oral, resp. rate 22, height 5' 8.11" (1.73 m), weight 169 lb 12.1 oz (77 kg), SpO2 99.00%. General: Well developed, well nourished WM, in no acute distress. Head: Normocephalic, atraumatic, sclera non-icteric, no xanthomas, nares are without discharge. Neck: Negative for carotid bruits. JVP not elevated. Lungs: Clear bilaterally to auscultation without wheezes, rales, or rhonchi. Breathing is unlabored. Heart: Irregular rhythm, borderline tachycardic, S1 S2 without murmurs, rubs, or gallops.  Abdomen: Soft, non-tender, non-distended with normoactive bowel sounds. No rebound/guarding. Extremities: No clubbing or cyanosis. No edema. Distal pedal pulses are 2+ and equal bilaterally. Neuro: Alert and oriented to self only. Follows commands.   Intake/Output Summary (Last 24 hours) at 08/05/13 0951 Last data filed at 08/05/13 0800  Gross per 24 hour  Intake     60 ml  Output    525 ml  Net   -465 ml    Inpatient Medications:  . ceFEPime (MAXIPIME) IV  1 g Intravenous Q12H  . docusate sodium  100 mg Oral BID  . enoxaparin (LOVENOX) injection  40 mg Subcutaneous Q24H  . levothyroxine  150 mcg Oral QAC breakfast  . pantoprazole  40 mg Oral Daily  . senna  2 tablet Oral BID  . sertraline  100 mg Oral Daily  . vancomycin  750 mg Intravenous Q24H   Infusions:  . sodium chloride 75 mL/hr at 08/04/13 1643  . diltiazem (CARDIZEM) infusion 20 mg/hr (08/05/13 0541)    Labs:  Recent Labs  08/04/13 0638  NA 141  K 3.8  CL 103  CO2 22  GLUCOSE  100*  BUN 42*  CREATININE 1.59*  CALCIUM 8.6    Recent Labs  08/04/13 0638  AST 69*  ALT 32  ALKPHOS 55  BILITOT 0.9  PROT 6.6  ALBUMIN 2.6*    Recent Labs  08/03/13 0440 08/04/13 0638  WBC 11.0* 9.4  HGB 7.2* 8.7*  HCT 21.2* 25.7*  MCV 90.6 87.1  PLT 233 276   No results found for this basename: CKTOTAL, CKMB, TROPONINI,  in the last 72 hours No components found with this basename: POCBNP,  No results found for this basename: HGBA1C,  in the last 72 hours   Radiology/Studies:  Dg Chest 1 View  07/31/2013   CLINICAL DATA:  fall, hip pain  EXAM: CHEST - 1 VIEW  COMPARISON:  07/10/2012  FINDINGS: VP shunt tubing partially seen over the right hemi thorax. Lungs clear. The mild edema suspected on previous study has resolved. Heart size normal. No effusion. Regional bones unremarkable.  IMPRESSION: 1. No acute disease   Electronically Signed   By: Arne Cleveland M.D.   On: 07/31/2013 09:42   Dg Hip Complete Right  07/31/2013   CLINICAL DATA:  HIP PAIN FALL  EXAM: RIGHT HIP - COMPLETE 2+ VIEW  COMPARISON:  07/21/2003  FINDINGS: There is a comminuted intertrochanteric fracture of the right femur. There are several cm displacement and foreshortening of major fracture fragments. The lesser trochanter is a separate fracture fragment. Femoral head remains located.  IMPRESSION:  1. Comminuted displaced right intertrochanteric femur fracture.   Electronically Signed   By: Arne Cleveland M.D.   On: 07/31/2013 09:40   Dg Femur Right  07/31/2013   CLINICAL DATA:  79 year old male -ORIF right femur fracture.  EXAM: RIGHT FEMUR - 2 VIEW; DG C-ARM 61-120 MIN  COMPARISON:  07/31/2013  FINDINGS: Intraoperative spot films of the right femur are submitted postoperatively for interpretation.  Nail and screw fixation of an intertrochanteric right femur fracture identified. The fracture is in near-anatomic alignment and position.  No complicating features are identified.  IMPRESSION: Internal fixation  of intertrochanteric right femur fracture.   Electronically Signed   By: Hassan Rowan M.D.   On: 07/31/2013 17:35   Dg Femur Right  07/31/2013   CLINICAL DATA:  HIP PAIN FALL  EXAM: RIGHT FEMUR - 2 VIEW  COMPARISON:  07/21/2003  FINDINGS: Comminuted intertrochanteric fracture of the right femur. Several cm of displacement and override of major fracture fragments. The lesser trochanter is a separate fracture fragment. The femoral head remains seated. Distal femur is intact. Patchy femoral-popliteal arterial calcifications.  IMPRESSION: 1. Comminuted displaced intertrochanteric fracture   Electronically Signed   By: Arne Cleveland M.D.   On: 07/31/2013 09:41   Ct Head Wo Contrast  07/31/2013   CLINICAL DATA:  Fall ; dementia  EXAM: CT HEAD WITHOUT CONTRAST  CT CERVICAL SPINE WITHOUT CONTRAST  COMPARISON:  Brain CT July 04, 2013  TECHNIQUE: Multidetector CT imaging of the head and cervical spine was performed following the standard protocol without intravenous contrast. Multiplanar CT image reconstructions of the cervical spine were also generated.  FINDINGS: CT HEAD: There is diffuse ventricular enlargement with less pronounced sulcal enlargement, stable. There is a shunt catheter with the tip in the right lateral ventricle, stable. There is no intracranial mass, hemorrhage, extra-axial fluid collection or midline shift. There is decreased attenuation surrounding the lateral ventricles, stable. There are small lacunar type infarcts in the lateral left lentiform nucleus as well as in both external capsules, stable. No new gray-white compartment lesion. No acute infarct.  Bony calvarium appears intact except for a shunt catheter placement defect in the anterior right parietal bone. Mastoid air cells are clear. There is ethmoid sinus disease bilaterally.  CT CERVICAL SPINE: There is no fracture or spondylolisthesis. Prevertebral soft tissues and predental space regions are normal. There is moderate disc space  narrowing at C4-5, C5-6, and C6-7. There is bony hypertrophy at most levels with multiple areas of as exit foraminal narrowing due to facet hypertrophy. No disc extrusion or stenosis. There is calcification in both carotid arteries.  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 Stable ventricular enlargement with shunt catheter in place, stable from prior study. Decreased attenuation adjacent to the ventricles is most likely due to a combination of small vessel disease and interstitial edema from the chronic ventricular enlargement. No acute infarct apparent. No hemorrhage or mass effect. There is ethmoid sinus disease bilaterally.  CT cervical spine: Multilevel spondylosis and osteoarthritic change. No fracture or spondylolisthesis. Calcification in both carotid arteries.   Electronically Signed   By: Lowella Grip M.D.   On: 07/31/2013 10:02   Ct Cervical Spine Wo Contrast  07/31/2013   CLINICAL DATA:  Fall ; dementia  EXAM: CT HEAD WITHOUT CONTRAST  CT CERVICAL SPINE WITHOUT CONTRAST  COMPARISON:  Brain CT July 04, 2013  TECHNIQUE: Multidetector CT imaging of the head and cervical spine was performed following the standard protocol without intravenous  contrast. Multiplanar CT image reconstructions of the cervical spine were also generated.  FINDINGS: CT HEAD: There is diffuse ventricular enlargement with less pronounced sulcal enlargement, stable. There is a shunt catheter with the tip in the right lateral ventricle, stable. There is no intracranial mass, hemorrhage, extra-axial fluid collection or midline shift. There is decreased attenuation surrounding the lateral ventricles, stable. There are small lacunar type infarcts in the lateral left lentiform nucleus as well as in both external capsules, stable. No new gray-white compartment lesion. No acute infarct.  Bony calvarium appears intact except for a shunt catheter placement defect in the anterior right  parietal bone. Mastoid air cells are clear. There is ethmoid sinus disease bilaterally.  CT CERVICAL SPINE: There is no fracture or spondylolisthesis. Prevertebral soft tissues and predental space regions are normal. There is moderate disc space narrowing at C4-5, C5-6, and C6-7. There is bony hypertrophy at most levels with multiple areas of as exit foraminal narrowing due to facet hypertrophy. No disc extrusion or stenosis. There is calcification in both carotid arteries.  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 Stable ventricular enlargement with shunt catheter in place, stable from prior study. Decreased attenuation adjacent to the ventricles is most likely due to a combination of small vessel disease and interstitial edema from the chronic ventricular enlargement. No acute infarct apparent. No hemorrhage or mass effect. There is ethmoid sinus disease bilaterally.  CT cervical spine: Multilevel spondylosis and osteoarthritic change. No fracture or spondylolisthesis. Calcification in both carotid arteries.   Electronically Signed   By: Lowella Grip M.D.   On: 07/31/2013 10:02   Dg Chest Port 1 View  08/03/2013   CLINICAL DATA:  Fever, pneumonia, postop  EXAM: PORTABLE CHEST - 1 VIEW  COMPARISON:  07/25/2013  FINDINGS: Cardiomediastinal silhouette is stable. Right VP shunt catheter is unchanged in position. No segmental infiltrate or pulmonary edema. Central mild vascular congestion.  IMPRESSION: No segmental infiltrate or pulmonary edema. Central mild vascular congestion.   Electronically Signed   By: Lahoma Crocker M.D.   On: 08/03/2013 11:38   Dg Chest Port 1 View  08/02/2013   CLINICAL DATA:  Fever.  EXAM: PORTABLE CHEST - 1 VIEW  COMPARISON:  Chest radiograph 07/31/2013.  FINDINGS: Patient is rotated. Stable enlarged cardiac and mediastinal contours. Low lung volumes. No consolidative pulmonary opacities. No pleural effusion or pneumothorax.  Showing catheter overlies the right hemi thorax.  IMPRESSION: No acute cardiopulmonary process.   Electronically Signed   By: Lovey Newcomer M.D.   On: 08/02/2013 18:26   Dg C-arm 1-60 Min  07/31/2013   CLINICAL DATA:  79 year old male -ORIF right femur fracture.  EXAM: RIGHT FEMUR - 2 VIEW; DG C-ARM 61-120 MIN  COMPARISON:  07/31/2013  FINDINGS: Intraoperative spot films of the right femur are submitted postoperatively for interpretation.  Nail and screw fixation of an intertrochanteric right femur fracture identified. The fracture is in near-anatomic alignment and position.  No complicating features are identified.  IMPRESSION: Internal fixation of intertrochanteric right femur fracture.   Electronically Signed   By: Hassan Rowan M.D.   On: 07/31/2013 17:35     Assessment and Plan  1. R hip fracture due to fall s/p open op 7/26 with h/o frequent falls 2. Atrial flutter with RVR, new onset, also with what appears to be brief atrial fib and wandering atrial pacemaker/PACs 3. Severe baseline dementia 4. CKD stage III 5. Hypertension 6. Medical issues including fever possibly due to aspiration,  possible UTI, ABL anemia, hydrocephalus s/p shunt 7. Hypothyroidism with abnormal TSH  Suspect arrhythmia triggered due to stress of surgery and post-op anemia. [place] be worthwhile to get an echo to reassess LV function since last one was last year. (If EF were to be down, we might consider changing diltiazem to BB instead.) QTc slightly prolonged - see pharmacy sticky note. Advise caution with Zofran, risperdone, Haldol and sertraline. Further management of medical issues per IM. Will add free T4 and defer adjustment of levothyroxine to primary team. If MD agrees, would change to oral diltiazem today. As per previous note, he is not an anticoagulation candidate.  Signed, Melina Copa PA-C  I have personally seen and examined this patient with Melina Copa, PA-C. I agree with the assessment and plan as outlined above.  Rate controlled on Cardizem IV. Will stop Cardizem IV and will start Cardizem CD 360 mg daily.   MCALHANY,CHRISTOPHER 08/05/2013 10:32 AM

## 2013-08-05 NOTE — Progress Notes (Signed)
Utilization review completed.  

## 2013-08-05 NOTE — Care Management Note (Signed)
    Page 1 of 1   08/08/2013     12:30:32 PM CARE MANAGEMENT NOTE 08/08/2013  Patient:  Walter Carey, Walter Carey   Account Number:  [id number]  Date Initiated:  08/01/2013  Documentation initiated by:  Ricki Miller  Subjective/Objective Assessment:   79 yr old male from [place], s/p right hip fracture with IM Nailing.     Action/Plan:   Patient for shortterm rehab at [facility], Social worker is aware.   Anticipated DC Date:  08/08/2013   Anticipated DC Plan:  SKILLED NURSING FACILITY  In-house referral  Clinical Social Worker      DC Planning Services  CM consult      Choice offered to / List presented to:     DME arranged  NA        [place] arranged  NA      Status of service:  Completed, signed off Medicare Important Message given?  YES (If response is "NO", the following Medicare IM given date fields will be blank) Date Medicare IM given:  08/05/2013 Medicare IM given by:  Marvetta Gibbons Date Additional Medicare IM given:  08/08/2013 Additional Medicare IM given by:  Marvetta Gibbons  Discharge Disposition:  [place]  Per UR Regulation:  Reviewed for med. necessity/level of care/duration of stay  If discussed at [place] of Stay Meetings, dates discussed:    Comments:  08/08/13- 1200- Marvetta Gibbons RN BSN [phone] Pt for d/c to SNF today- CSW following for placement needs.  08/05/13- 1220- Samanthajo Payano RN, BSN- [phone] Pt went into [facility] and started on IV Cardizem gtt- d/c plan is to [facility] when medically stable-

## 2013-08-05 NOTE — Progress Notes (Signed)
[facility] PROGRESS NOTE  Walter Carey ZWC:[id number] DOB: February 20, 1934 DOA: 07/31/2013 PCP: Marjorie Smolder, MD  Assessment/Plan: Principal Problem:   Closed intertrochanteric fracture of hip Active Problems:   Dementia   Falls   Normal pressure hydrocephalus   Agitation   Hip fracture  HPI/Subjective:  Sleepy but easy to arouse, denies any complaints.  Assessment/Plan:   Atrial Flutter  -New onset, on Cardizem drip, Cardiology following, also Lopressor as needed. -Rate is 105-123 since the drip started. BP is marginal. -Follow cards recommendations.  Closed intertrochanteric fracture of hip  -S/p ORIF on 7.26.2015.  -Avoid narcotics use haldol for agitation.  -Tylenol for pain. -Awaiting SNF placement.   Unknown source Fever:  -Developed fever of of 102.1 on 7/28. -CRX, UA and blood culture are negative. -Most likely secondary to aspiration, continue vancomycin and cefepime empirically  -Pt on lovenox, tylenol for fever.  -Dys 1 diet, start vanc and cefepime    Acute blood loss anemia  -Hemoglobin drop from 11.6 and 7.2 , now improving  -Most likely lost blood subsequent to the fall, he has a big bruise in his right flank  -S/P transfusion of 1 unit of packed RBCs, follow CBC  Fall:  - Frequent falls due to NPH, dementia; cognitive impairment, gait disturbance, and urinary incontinence  - s/p VP shunt, likely not helpful since progressive disease;  - hold amitriptyline.   HTN:  - stable; cont home regimen   Probable UTI, leukocytosis;  - ? Treated at SNF:  - start empiric atx, negative cultures.  CKD stage III:  -Stable, around his baseline.  Code Status: DNR  Family Communication: d/w patient, family, Versie Starks [facility] [place]  Disposition Plan: Transferred to step down for Cardizem drip  Consultants:  Ortho Procedures:  S/p Orthopedic Antibiotics:  none    Objective: Filed Vitals:   08/05/13 0000 08/05/13 0318 08/05/13  0330 08/05/13 0700  BP: 100/80  111/52 104/62  Pulse:    56  Temp:  98 F (36.7 C)  98.2 F (36.8 C)  TempSrc:  Oral  Oral  Resp: 32  20 22  Height:      Weight:      SpO2: 98%  96% 99%    Intake/Output Summary (Last 24 hours) at 08/05/13 0853 Last data filed at 08/05/13 0800  Gross per 24 hour  Intake     60 ml  Output    525 ml  Net   -465 ml    Exam:  General: in no acute distress.  HEENT: No bruits, no goiter.  Heart: Regular rate and rhythm.  Lungs: Good air movement, clear  Abdomen: Soft, nontender, nondistended, positive bowel sounds.  Neuro: Grossly intact, nonfocal    Data Reviewed: Basic Metabolic Panel:  Recent Labs Lab 07/31/13 1000 08/01/13 0650 08/02/13 0432 08/04/13 0638  NA 137 137 138 141  K 3.8 4.5 4.1 3.8  CL 99 98 102 103  CO2 23 23 22 22   GLUCOSE 164* 115* 161* 100*  BUN 28* 40* 44* 42*  CREATININE 1.83* 2.05* 2.07* 1.59*  CALCIUM 9.0 8.8 8.6 8.6    Liver Function Tests:  Recent Labs Lab 07/31/13 1000 08/01/13 0650 08/04/13 0638  AST 15 73* 69*  ALT 10 17 32  ALKPHOS 74 60 55  BILITOT 0.4 0.6 0.9  PROT 7.0 6.5 6.6  ALBUMIN 3.4* 3.1* 2.6*   No results found for this basename: LIPASE, AMYLASE,  in the last 168 hours No results found for  this basename: AMMONIA,  in the last 168 hours  CBC:  Recent Labs Lab 07/31/13 1000 08/01/13 0650 08/02/13 0432 08/03/13 0440 08/04/13 0638  WBC 14.1* 13.3* 13.3* 11.0* 9.4  NEUTROABS 12.4*  --   --   --   --   HGB 11.4* 8.5* 7.5* 7.2* 8.7*  HCT 32.7* 25.6* 21.2* 21.2* 25.7*  MCV 88.1 91.8 88.7 90.6 87.1  PLT 304 257 241 233 276    Cardiac Enzymes: No results found for this basename: CKTOTAL, CKMB, CKMBINDEX, TROPONINI,  in the last 168 hours BNP (last 3 results) No results found for this basename: PROBNP,  in the last 8760 hours   CBG: No results found for this basename: GLUCAP,  in the last 168 hours  Recent Results (from the past 240 hour(s))  URINE CULTURE     Status:  None   Collection Time    07/31/13 10:21 PM      Result Value Ref Range Status   Specimen Description URINE, RANDOM   Final   Special Requests NONE   Final   Culture  Setup Time     Final   Value: 08/01/2013 10:02     Performed at [place]     Final   Value: NO GROWTH     Performed at [facility]   Culture     Final   Value: NO GROWTH     Performed at [facility]   Report Status 08/02/2013 FINAL   Final  CULTURE, BLOOD (ROUTINE X 2)     Status: None   Collection Time    08/02/13  3:00 PM      Result Value Ref Range Status   Specimen Description BLOOD RIGHT ARM   Final   Special Requests BOTTLES DRAWN AEROBIC AND ANAEROBIC 10CC   Final   Culture  Setup Time     Final   Value: 08/02/2013 19:06     Performed at [facility]   Culture     Final   Value:        BLOOD CULTURE RECEIVED NO GROWTH TO DATE CULTURE WILL BE HELD FOR 5 DAYS BEFORE ISSUING A FINAL NEGATIVE REPORT     Performed at [facility]   Report Status PENDING   Incomplete  CULTURE, BLOOD (ROUTINE X 2)     Status: None   Collection Time    08/02/13  3:15 PM      Result Value Ref Range Status   Specimen Description BLOOD LEFT HAND   Final   Special Requests BOTTLES DRAWN AEROBIC AND ANAEROBIC 10CC   Final   Culture  Setup Time     Final   Value: 08/02/2013 19:07     Performed at [facility]   Culture     Final   Value:        BLOOD CULTURE RECEIVED NO GROWTH TO DATE CULTURE WILL BE HELD FOR 5 DAYS BEFORE ISSUING A FINAL NEGATIVE REPORT     Performed at [facility]   Report Status PENDING   Incomplete  MRSA PCR SCREENING     Status: None   Collection Time    08/04/13  7:11 PM      Result Value Ref Range Status   MRSA by PCR NEGATIVE  NEGATIVE Final   Comment:            The GeneXpert MRSA Assay (FDA     approved for  NASAL specimens     only), is one component of a     comprehensive MRSA colonization     surveillance program. It is  not     intended to diagnose MRSA     infection nor to guide or     monitor treatment for     MRSA infections.     Studies: Dg Chest 1 View  07/31/2013   CLINICAL DATA:  fall, hip pain  EXAM: CHEST - 1 VIEW  COMPARISON:  07/10/2012  FINDINGS: VP shunt tubing partially seen over the right hemi thorax. Lungs clear. The mild edema suspected on previous study has resolved. Heart size normal. No effusion. Regional bones unremarkable.  IMPRESSION: 1. No acute disease   Electronically Signed   By: Arne Cleveland M.D.   On: 07/31/2013 09:42   Dg Hip Complete Right  07/31/2013   CLINICAL DATA:  HIP PAIN FALL  EXAM: RIGHT HIP - COMPLETE 2+ VIEW  COMPARISON:  07/21/2003  FINDINGS: There is a comminuted intertrochanteric fracture of the right femur. There are several cm displacement and foreshortening of major fracture fragments. The lesser trochanter is a separate fracture fragment. Femoral head remains located.  IMPRESSION: 1. Comminuted displaced right intertrochanteric femur fracture.   Electronically Signed   By: Arne Cleveland M.D.   On: 07/31/2013 09:40   Dg Femur Right  07/31/2013   CLINICAL DATA:  79 year old male -ORIF right femur fracture.  EXAM: RIGHT FEMUR - 2 VIEW; DG C-ARM 61-120 MIN  COMPARISON:  07/31/2013  FINDINGS: Intraoperative spot films of the right femur are submitted postoperatively for interpretation.  Nail and screw fixation of an intertrochanteric right femur fracture identified. The fracture is in near-anatomic alignment and position.  No complicating features are identified.  IMPRESSION: Internal fixation of intertrochanteric right femur fracture.   Electronically Signed   By: Hassan Rowan M.D.   On: 07/31/2013 17:35   Dg Femur Right  07/31/2013   CLINICAL DATA:  HIP PAIN FALL  EXAM: RIGHT FEMUR - 2 VIEW  COMPARISON:  07/21/2003  FINDINGS: Comminuted intertrochanteric fracture of the right femur. Several cm of displacement and override of major fracture fragments. The lesser  trochanter is a separate fracture fragment. The femoral head remains seated. Distal femur is intact. Patchy femoral-popliteal arterial calcifications.  IMPRESSION: 1. Comminuted displaced intertrochanteric fracture   Electronically Signed   By: Arne Cleveland M.D.   On: 07/31/2013 09:41   Ct Head Wo Contrast  07/31/2013   CLINICAL DATA:  Fall ; dementia  EXAM: CT HEAD WITHOUT CONTRAST  CT CERVICAL SPINE WITHOUT CONTRAST  COMPARISON:  Brain CT July 04, 2013  TECHNIQUE: Multidetector CT imaging of the head and cervical spine was performed following the standard protocol without intravenous contrast. Multiplanar CT image reconstructions of the cervical spine were also generated.  FINDINGS: CT HEAD: There is diffuse ventricular enlargement with less pronounced sulcal enlargement, stable. There is a shunt catheter with the tip in the right lateral ventricle, stable. There is no intracranial mass, hemorrhage, extra-axial fluid collection or midline shift. There is decreased attenuation surrounding the lateral ventricles, stable. There are small lacunar type infarcts in the lateral left lentiform nucleus as well as in both external capsules, stable. No new gray-white compartment lesion. No acute infarct.  Bony calvarium appears intact except for a shunt catheter placement defect in the anterior right parietal bone. Mastoid air cells are clear. There is ethmoid sinus disease bilaterally.  CT CERVICAL SPINE: There is no fracture or  spondylolisthesis. Prevertebral soft tissues and predental space regions are normal. There is moderate disc space narrowing at C4-5, C5-6, and C6-7. There is bony hypertrophy at most levels with multiple areas of as exit foraminal narrowing due to facet hypertrophy. No disc extrusion or stenosis. There is calcification in both carotid arteries.  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 Stable ventricular enlargement with shunt catheter in  place, stable from prior study. Decreased attenuation adjacent to the ventricles is most likely due to a combination of small vessel disease and interstitial edema from the chronic ventricular enlargement. No acute infarct apparent. No hemorrhage or mass effect. There is ethmoid sinus disease bilaterally.  CT cervical spine: Multilevel spondylosis and osteoarthritic change. No fracture or spondylolisthesis. Calcification in both carotid arteries.   Electronically Signed   By: Lowella Grip M.D.   On: 07/31/2013 10:02   Ct Cervical Spine Wo Contrast  07/31/2013   CLINICAL DATA:  Fall ; dementia  EXAM: CT HEAD WITHOUT CONTRAST  CT CERVICAL SPINE WITHOUT CONTRAST  COMPARISON:  Brain CT July 04, 2013  TECHNIQUE: Multidetector CT imaging of the head and cervical spine was performed following the standard protocol without intravenous contrast. Multiplanar CT image reconstructions of the cervical spine were also generated.  FINDINGS: CT HEAD: There is diffuse ventricular enlargement with less pronounced sulcal enlargement, stable. There is a shunt catheter with the tip in the right lateral ventricle, stable. There is no intracranial mass, hemorrhage, extra-axial fluid collection or midline shift. There is decreased attenuation surrounding the lateral ventricles, stable. There are small lacunar type infarcts in the lateral left lentiform nucleus as well as in both external capsules, stable. No new gray-white compartment lesion. No acute infarct.  Bony calvarium appears intact except for a shunt catheter placement defect in the anterior right parietal bone. Mastoid air cells are clear. There is ethmoid sinus disease bilaterally.  CT CERVICAL SPINE: There is no fracture or spondylolisthesis. Prevertebral soft tissues and predental space regions are normal. There is moderate disc space narrowing at C4-5, C5-6, and C6-7. There is bony hypertrophy at most levels with multiple areas of as exit foraminal narrowing due to  facet hypertrophy. No disc extrusion or stenosis. There is calcification in both carotid arteries.  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 Stable ventricular enlargement with shunt catheter in place, stable from prior study. Decreased attenuation adjacent to the ventricles is most likely due to a combination of small vessel disease and interstitial edema from the chronic ventricular enlargement. No acute infarct apparent. No hemorrhage or mass effect. There is ethmoid sinus disease bilaterally.  CT cervical spine: Multilevel spondylosis and osteoarthritic change. No fracture or spondylolisthesis. Calcification in both carotid arteries.   Electronically Signed   By: Lowella Grip M.D.   On: 07/31/2013 10:02   Dg Chest Port 1 View  08/03/2013   CLINICAL DATA:  Fever, pneumonia, postop  EXAM: PORTABLE CHEST - 1 VIEW  COMPARISON:  07/25/2013  FINDINGS: Cardiomediastinal silhouette is stable. Right VP shunt catheter is unchanged in position. No segmental infiltrate or pulmonary edema. Central mild vascular congestion.  IMPRESSION: No segmental infiltrate or pulmonary edema. Central mild vascular congestion.   Electronically Signed   By: Lahoma Crocker M.D.   On: 08/03/2013 11:38   Dg Chest Port 1 View  08/02/2013   CLINICAL DATA:  Fever.  EXAM: PORTABLE CHEST - 1 VIEW  COMPARISON:  Chest radiograph 07/31/2013.  FINDINGS: Patient is rotated. Stable enlarged cardiac  and mediastinal contours. Low lung volumes. No consolidative pulmonary opacities. No pleural effusion or pneumothorax. Showing catheter overlies the right hemi thorax.  IMPRESSION: No acute cardiopulmonary process.   Electronically Signed   By: Lovey Newcomer M.D.   On: 08/02/2013 18:26   Dg C-arm 1-60 Min  07/31/2013   CLINICAL DATA:  79 year old male -ORIF right femur fracture.  EXAM: RIGHT FEMUR - 2 VIEW; DG C-ARM 61-120 MIN  COMPARISON:  07/31/2013  FINDINGS: Intraoperative spot films of the  right femur are submitted postoperatively for interpretation.  Nail and screw fixation of an intertrochanteric right femur fracture identified. The fracture is in near-anatomic alignment and position.  No complicating features are identified.  IMPRESSION: Internal fixation of intertrochanteric right femur fracture.   Electronically Signed   By: Hassan Rowan M.D.   On: 07/31/2013 17:35    Scheduled Meds: . ceFEPime (MAXIPIME) IV  1 g Intravenous Q12H  . docusate sodium  100 mg Oral BID  . enoxaparin (LOVENOX) injection  40 mg Subcutaneous Q24H  . levothyroxine  150 mcg Oral QAC breakfast  . pantoprazole  40 mg Oral Daily  . senna  2 tablet Oral BID  . sertraline  100 mg Oral Daily  . vancomycin  750 mg Intravenous Q24H   Continuous Infusions: . sodium chloride 75 mL/hr at 08/04/13 1643  . sodium chloride 75 mL/hr at 08/02/13 1543  . diltiazem (CARDIZEM) infusion 20 mg/hr (08/05/13 0541)    Principal Problem:   Closed intertrochanteric fracture of hip Active Problems:   Dementia   Falls   Normal pressure hydrocephalus   Agitation   Hip fracture    Time spent: 40 minutes   [facility] Pager [phone]. If 8PM-8AM, please contact night-coverage at [email or web address], password [facility] 08/05/2013, 8:53 AM  LOS: 5 days

## 2013-08-05 NOTE — Progress Notes (Signed)
ANTIBIOTIC CONSULT NOTE - Follow up  Pharmacy Consult for vanc Indication: pneumonia  Allergies  Allergen Reactions  . Asa [Aspirin]   . Erythromycin   . Morphine And Related   . Oxycodone   . Stadol [Butorphanol]   . Tylox [Oxycodone-Acetaminophen]   . Versed [Midazolam]     Patient Measurements: Height: 5' 8.11" (173 cm) Weight: 169 lb 12.1 oz (77 kg) IBW/kg (Calculated) : 68.65  Vital Signs: Temp: 98.2 F (36.8 C) (07/31 0700) Temp src: Oral (07/31 0700) BP: 118/62 mmHg (07/31 1100) Pulse Rate: 74 (07/31 1100) Intake/Output from previous day: 07/30 0701 - 07/31 0700 In: 270 [P.O.:240; I.V.:30] Out: 400 [Urine:400] Intake/Output from this shift: Total I/O In: 30 [I.V.:30] Out: 125 [Urine:125]  Labs:  Recent Labs  08/03/13 0440 08/04/13 0638  WBC 11.0* 9.4  HGB 7.2* 8.7*  PLT 233 276  CREATININE  --  1.59*   Estimated Creatinine Clearance: 38.4 ml/min (by C-G formula based on Cr of 1.59). No results found for this basename: VANCOTROUGH, Corlis Leak, VANCORANDOM, [place], GENTPEAK, GENTRANDOM, TOBRATROUGH, TOBRAPEAK, TOBRARND, AMIKACINPEAK, AMIKACINTROU, AMIKACIN,  in the last 72 hours   Microbiology: Recent Results (from the past 720 hour(s))  URINE CULTURE     Status: None   Collection Time    07/31/13 10:21 PM      Result Value Ref Range Status   Specimen Description URINE, RANDOM   Final   Special Requests NONE   Final   Culture  Setup Time     Final   Value: 08/01/2013 10:02     Performed at [place]     Final   Value: NO GROWTH     Performed at [facility]   Culture     Final   Value: NO GROWTH     Performed at [facility]   Report Status 08/02/2013 FINAL   Final  CULTURE, BLOOD (ROUTINE X 2)     Status: None   Collection Time    08/02/13  3:00 PM      Result Value Ref Range Status   Specimen Description BLOOD RIGHT ARM   Final   Special Requests BOTTLES DRAWN AEROBIC AND ANAEROBIC 10CC    Final   Culture  Setup Time     Final   Value: 08/02/2013 19:06     Performed at [facility]   Culture     Final   Value:        BLOOD CULTURE RECEIVED NO GROWTH TO DATE CULTURE WILL BE HELD FOR 5 DAYS BEFORE ISSUING A FINAL NEGATIVE REPORT     Performed at [facility]   Report Status PENDING   Incomplete  CULTURE, BLOOD (ROUTINE X 2)     Status: None   Collection Time    08/02/13  3:15 PM      Result Value Ref Range Status   Specimen Description BLOOD LEFT HAND   Final   Special Requests BOTTLES DRAWN AEROBIC AND ANAEROBIC 10CC   Final   Culture  Setup Time     Final   Value: 08/02/2013 19:07     Performed at [facility]   Culture     Final   Value:        BLOOD CULTURE RECEIVED NO GROWTH TO DATE CULTURE WILL BE HELD FOR 5 DAYS BEFORE ISSUING A FINAL NEGATIVE REPORT     Performed at [facility]   Report Status PENDING  Incomplete  MRSA PCR SCREENING     Status: None   Collection Time    08/04/13  7:11 PM      Result Value Ref Range Status   MRSA by PCR NEGATIVE  NEGATIVE Final   Comment:            The GeneXpert MRSA Assay (FDA     approved for NASAL specimens     only), is one component of a     comprehensive MRSA colonization     surveillance program. It is not     intended to diagnose MRSA     infection nor to guide or     monitor treatment for     MRSA infections.    Medical History: Past Medical History  Diagnosis Date  . Hypertension   . Dementia   . Hydrocephalus with operating shunt   . Neuropathy   . AVNRT (AV nodal re-entry tachycardia)   . Falls   . CKD (chronic kidney disease), stage III   . Normal pressure hydrocephalus 12/24/2012  . Abnormality of gait 12/24/2012  . Dyslipidemia   . Hypothyroidism   . Personal history of subdural hematoma     Bilateral, following VP shunt placement  . Migraine headache   . Cancer     Prostate cancer  . Memory loss     Medications:  Prescriptions prior to admission   Medication Sig Dispense Refill  . amitriptyline (ELAVIL) 25 MG tablet Take 25 mg by mouth at bedtime.      Marland Kitchen CALCIUM-VITAMIN D PO Take 1 tablet by mouth daily.       Marland Kitchen diltiazem (CARDIZEM CD) 240 MG 24 hr capsule Take 1 capsule (240 mg total) by mouth daily.      Marland Kitchen donepezil (ARICEPT) 10 MG tablet Take 10 mg by mouth at bedtime.      . fish oil-omega-3 fatty acids 1000 MG capsule Take 1 g by mouth 3 (three) times daily.       Marland Kitchen levothyroxine (SYNTHROID, LEVOTHROID) 150 MCG tablet Take 150 mcg by mouth daily before breakfast.      . Multiple Vitamin (MULTIVITAMIN WITH MINERALS) TABS tablet Take 1 tablet by mouth daily.      Marland Kitchen omeprazole (PRILOSEC) 20 MG capsule Take 20 mg by mouth 2 (two) times daily.      . QUEtiapine (SEROQUEL) 25 MG tablet Take 1 tablet (25 mg total) by mouth at bedtime.      . risperiDONE (RISPERDAL) 0.25 MG tablet Take 1 tablet (0.25 mg total) by mouth 2 (two) times daily as needed (for agitation).  30 tablet  0  . sertraline (ZOLOFT) 100 MG tablet Take 100 mg by mouth daily.      Marland Kitchen thiamine (VITAMIN B-1) 100 MG tablet Take 300 mg by mouth daily.      . vitamin E 400 UNIT capsule Take 400 Units by mouth 2 (two) times daily.       Scheduled:  . ceFEPime (MAXIPIME) IV  1 g Intravenous Q12H  . diltiazem  360 mg Oral Daily  . docusate sodium  100 mg Oral BID  . enoxaparin (LOVENOX) injection  40 mg Subcutaneous Q24H  . levothyroxine  150 mcg Oral QAC breakfast  . pantoprazole  40 mg Oral Daily  . senna  2 tablet Oral BID  . sertraline  100 mg Oral Daily  . vancomycin  750 mg Intravenous Q24H   Infusions:  . sodium chloride 75 mL/hr at 08/04/13 1643  Assessment: 49 yoM on day #4 vancomycin and cefepime empirically for possible aspiration pneumonia.   CKD with SCr improved to 1.59, Crcl~38.  WBC down to 9.4K. Afebrile  Goal of Therapy:  Vancomycin trough level 15-20 mcg/ml  Plan:  Begin Vanc 750mg  Q24H Monitor clinical progress, renal function, levels when  appropriate.  Will check vanc trough tomorrow 08/06/13.  Nicole Cella, RPh Clinical Pharmacist Pager: [phone] 08/05/2013 11:07 AM

## 2013-08-06 LAB — CBC
HEMATOCRIT: 26 % — AB (ref 39.0–52.0)
Hemoglobin: 8.7 g/dL — ABNORMAL LOW (ref 13.0–17.0)
MCH: 29.8 pg (ref 26.0–34.0)
MCHC: 33.5 g/dL (ref 30.0–36.0)
MCV: 89 fL (ref 78.0–100.0)
Platelets: 342 10*3/uL (ref 150–400)
RBC: 2.92 MIL/uL — ABNORMAL LOW (ref 4.22–5.81)
RDW: 13.7 % (ref 11.5–15.5)
WBC: 8.5 10*3/uL (ref 4.0–10.5)

## 2013-08-06 LAB — BASIC METABOLIC PANEL
ANION GAP: 11 (ref 5–15)
BUN: 35 mg/dL — ABNORMAL HIGH (ref 6–23)
CALCIUM: 8.7 mg/dL (ref 8.4–10.5)
CHLORIDE: 102 meq/L (ref 96–112)
CO2: 21 meq/L (ref 19–32)
Creatinine, Ser: 1.52 mg/dL — ABNORMAL HIGH (ref 0.50–1.35)
GFR calc non Af Amer: 43 mL/min — ABNORMAL LOW (ref >90)
GFR, EST AFRICAN AMERICAN: 50 mL/min — AB (ref >90)
Glucose, Bld: 98 mg/dL (ref 70–99)
Potassium: 4.6 mEq/L (ref 3.7–5.3)
SODIUM: 134 meq/L — AB (ref 137–147)

## 2013-08-06 LAB — VANCOMYCIN, TROUGH: Vancomycin Tr: 8.5 ug/mL — ABNORMAL LOW (ref 10.0–20.0)

## 2013-08-06 MED ORDER — LEVOTHYROXINE SODIUM 175 MCG PO TABS
175.0000 ug | ORAL_TABLET | Freq: Every day | ORAL | Status: DC
  Administered 2013-08-07 – 2013-08-08 (×2): 175 ug via ORAL
  Filled 2013-08-06 (×3): qty 1

## 2013-08-06 MED ORDER — VANCOMYCIN HCL IN DEXTROSE 1-5 GM/200ML-% IV SOLN
1000.0000 mg | INTRAVENOUS | Status: DC
  Administered 2013-08-07 – 2013-08-08 (×2): 1000 mg via INTRAVENOUS
  Filled 2013-08-06 (×3): qty 200

## 2013-08-06 NOTE — Progress Notes (Signed)
Subjective:  Sleepy and lethargic and really can't get to wake up that much.  Objective:  Vital Signs in the last 24 hours: BP 124/61  Pulse 52  Temp(Src) 98.4 F (36.9 C) (Axillary)  Resp 18  Ht 5' 8.11" (1.73 m)  Wt 74.7 kg (164 lb 10.9 oz)  BMI 24.96 kg/m2  SpO2 98%  Physical Exam: Elderly male who is sleeping at the present time Lungs:  Clear  Cardiac:  Regular rhythm with irregular beats, normal S1 and S2, no S3 Extremities:  No edema present  Intake/Output from previous day: 07/31 0701 - 08/01 0700 In: 380 [I.V.:180; IV Piggyback:200] Out: 1225 [Urine:1225] Weight Filed Weights   08/03/13 0500 08/04/13 0428 08/06/13 0500  Weight: 73.6 kg (162 lb 4.1 oz) 77 kg (169 lb 12.1 oz) 74.7 kg (164 lb 10.9 oz)    Lab Results: Basic Metabolic Panel:  Recent Labs  08/04/13 0638 08/06/13 0655  NA 141 134*  K 3.8 4.6  CL 103 102  CO2 22 21  GLUCOSE 100* 98  BUN 42* 35*  CREATININE 1.59* 1.52*    CBC:  Recent Labs  08/04/13 0638 08/06/13 0655  WBC 9.4 8.5  HGB 8.7* 8.7*  HCT 25.7* 26.0*  MCV 87.1 89.0  PLT 276 342    BNP No results found for this basename: probnp    PROTIME: Lab Results  Component Value Date   INR 1.24 08/01/2013   INR 1.07 07/31/2013    Telemetry: Currently sinus rhythm with PACs, brief episodes of wandering atrial pacemaker and brief atrial fibrillation and past  Assessment/Plan:  1. Paroxysmal atrial flutter and fibrillation triggered due to stress of surgery and anemia not thought to be a candidate for anticoagulation 2. Severe dementia 3. Recent hip surgery   Currently on oral diltiazem at the present time. Really not a good candidate for anticoagulation. Currently in sinus with PACs. Palliative care on board     W. Doristine Church  MD [facility]  08/06/2013, 10:45 AM

## 2013-08-06 NOTE — Progress Notes (Signed)
ANTIBIOTIC CONSULT NOTE - Follow up  Pharmacy Consult for vancomycin Indication: pneumonia  Allergies  Allergen Reactions  . Asa [Aspirin]   . Erythromycin   . Morphine And Related   . Oxycodone   . Stadol [Butorphanol]   . Tylox [Oxycodone-Acetaminophen]   . Versed [Midazolam]     Patient Measurements: Height: 5' 8.11" (173 cm) Weight: 164 lb 10.9 oz (74.7 kg) IBW/kg (Calculated) : 68.65  Vital Signs: Temp: 97.9 F (36.6 C) (08/01 1620) Temp src: Oral (08/01 1620) BP: 106/79 mmHg (08/01 1620) Pulse Rate: 39 (08/01 1620) Intake/Output from previous day: 07/31 0701 - 08/01 0700 In: 380 [I.V.:180; IV Piggyback:200] Out: 1225 [Urine:1225] Intake/Output from this shift: Total I/O In: 100 [IV Piggyback:100] Out: 1500 [Urine:1500]  Labs:  Recent Labs  08/04/13 0638 08/06/13 0655  WBC 9.4 8.5  HGB 8.7* 8.7*  PLT 276 342  CREATININE 1.59* 1.52*   Estimated Creatinine Clearance: 40.2 ml/min (by C-G formula based on Cr of 1.52).  Recent Labs  08/06/13 1715  VANCOTROUGH 8.5*     Microbiology: Recent Results (from the past 720 hour(s))  URINE CULTURE     Status: None   Collection Time    07/31/13 10:21 PM      Result Value Ref Range Status   Specimen Description URINE, RANDOM   Final   Special Requests NONE   Final   Culture  Setup Time     Final   Value: 08/01/2013 10:02     Performed at [place]     Final   Value: NO GROWTH     Performed at [facility]   Culture     Final   Value: NO GROWTH     Performed at [facility]   Report Status 08/02/2013 FINAL   Final  CULTURE, BLOOD (ROUTINE X 2)     Status: None   Collection Time    08/02/13  3:00 PM      Result Value Ref Range Status   Specimen Description BLOOD RIGHT ARM   Final   Special Requests BOTTLES DRAWN AEROBIC AND ANAEROBIC 10CC   Final   Culture  Setup Time     Final   Value: 08/02/2013 19:06     Performed at [facility]   Culture      Final   Value:        BLOOD CULTURE RECEIVED NO GROWTH TO DATE CULTURE WILL BE HELD FOR 5 DAYS BEFORE ISSUING A FINAL NEGATIVE REPORT     Performed at [facility]   Report Status PENDING   Incomplete  CULTURE, BLOOD (ROUTINE X 2)     Status: None   Collection Time    08/02/13  3:15 PM      Result Value Ref Range Status   Specimen Description BLOOD LEFT HAND   Final   Special Requests BOTTLES DRAWN AEROBIC AND ANAEROBIC 10CC   Final   Culture  Setup Time     Final   Value: 08/02/2013 19:07     Performed at [facility]   Culture     Final   Value:        BLOOD CULTURE RECEIVED NO GROWTH TO DATE CULTURE WILL BE HELD FOR 5 DAYS BEFORE ISSUING A FINAL NEGATIVE REPORT     Performed at [facility]   Report Status PENDING   Incomplete  MRSA PCR SCREENING     Status: None  Collection Time    08/04/13  7:11 PM      Result Value Ref Range Status   MRSA by PCR NEGATIVE  NEGATIVE Final   Comment:            The GeneXpert MRSA Assay (FDA     approved for NASAL specimens     only), is one component of a     comprehensive MRSA colonization     surveillance program. It is not     intended to diagnose MRSA     infection nor to guide or     monitor treatment for     MRSA infections.    Medical History: Past Medical History  Diagnosis Date  . Hypertension   . Dementia   . Hydrocephalus with operating shunt   . Neuropathy   . AVNRT (AV nodal re-entry tachycardia)   . Falls   . CKD (chronic kidney disease), stage III   . Normal pressure hydrocephalus 12/24/2012  . Abnormality of gait 12/24/2012  . Dyslipidemia   . Hypothyroidism   . Personal history of subdural hematoma     Bilateral, following VP shunt placement  . Migraine headache   . Cancer     Prostate cancer  . Memory loss     Medications:  Prescriptions prior to admission  Medication Sig Dispense Refill  . amitriptyline (ELAVIL) 25 MG tablet Take 25 mg by mouth at bedtime.      Marland Kitchen  CALCIUM-VITAMIN D PO Take 1 tablet by mouth daily.       Marland Kitchen diltiazem (CARDIZEM CD) 240 MG 24 hr capsule Take 1 capsule (240 mg total) by mouth daily.      Marland Kitchen donepezil (ARICEPT) 10 MG tablet Take 10 mg by mouth at bedtime.      . fish oil-omega-3 fatty acids 1000 MG capsule Take 1 g by mouth 3 (three) times daily.       Marland Kitchen levothyroxine (SYNTHROID, LEVOTHROID) 150 MCG tablet Take 150 mcg by mouth daily before breakfast.      . Multiple Vitamin (MULTIVITAMIN WITH MINERALS) TABS tablet Take 1 tablet by mouth daily.      Marland Kitchen omeprazole (PRILOSEC) 20 MG capsule Take 20 mg by mouth 2 (two) times daily.      . QUEtiapine (SEROQUEL) 25 MG tablet Take 1 tablet (25 mg total) by mouth at bedtime.      . risperiDONE (RISPERDAL) 0.25 MG tablet Take 1 tablet (0.25 mg total) by mouth 2 (two) times daily as needed (for agitation).  30 tablet  0  . sertraline (ZOLOFT) 100 MG tablet Take 100 mg by mouth daily.      Marland Kitchen thiamine (VITAMIN B-1) 100 MG tablet Take 300 mg by mouth daily.      . vitamin E 400 UNIT capsule Take 400 Units by mouth 2 (two) times daily.       Scheduled:  . ceFEPime (MAXIPIME) IV  1 g Intravenous Q12H  . diltiazem  360 mg Oral Daily  . docusate sodium  100 mg Oral BID  . enoxaparin (LOVENOX) injection  40 mg Subcutaneous Q24H  . [START ON 08/07/2013] levothyroxine  175 mcg Oral QAC breakfast  . pantoprazole  40 mg Oral Daily  . senna  2 tablet Oral BID  . sertraline  100 mg Oral Daily  . vancomycin  750 mg Intravenous Q24H   Infusions:  . sodium chloride Stopped (08/05/13 1400)   Assessment: 78 yoM on day #5 vancomycin and cefepime empirically for  possible aspiration pneumonia.   CKD with SCr improved to 1.5, Crcl~40.  WBC down to 8.5K. Afebrile  Vancomycin trough today was below goal at 8.5.   Will increase to 1g IV q24 hours - to give expected trough ~17  7/28 bld x2- ngtd 7/26 urine - ng  Goal of Therapy:  Vancomycin trough level 15-20 mcg/ml  Plan:  Increase to Vanc 1000mg   Q18H Monitor clinical progress, renal function, levels when appropriate.  Will check vanc trough as indicated  Erin Hearing PharmD., BCPS Clinical Pharmacist Pager [phone] 08/06/2013 6:39 PM

## 2013-08-06 NOTE — Progress Notes (Signed)
[facility] PROGRESS NOTE  Walter Carey ZES:[id number] DOB: 1934/02/26 DOA: 07/31/2013 PCP: Marjorie Smolder, MD  Assessment/Plan: Principal Problem:   Closed intertrochanteric fracture of hip Active Problems:   Dementia   Falls   Normal pressure hydrocephalus   Agitation   Hip fracture  HPI/Subjective:  Sleepy but easy to arouse, confused denies any pain.  Assessment/Plan:   Atrial Flutter  -New onset, on Cardizem drip, Cardiology following, also Lopressor as needed. -Rate is more controlled today, taken off of the IV drip and started on Cardizem 360 CD daily, blood pressure is okay. -Follow the cardiology recommendation for anticoagulation.  Closed intertrochanteric fracture of hip  -S/p ORIF on 7.26.2015.  -Avoid narcotics use haldol for agitation.  -Tylenol for pain. -Awaiting SNF placement.   Unknown source Fever:  -Developed fever of of 102.1 on 7/28. -CRX, UA and blood culture are negative. -Most likely secondary to aspiration, continue vancomycin and cefepime empirically  -Pt on lovenox, tylenol for fever.  -Dys 1 diet, start vanc and cefepime    Hypothyroidism -Elevated TSH of 8.050, patient is on levothyroxine at 150. -I will increase the dose slightly.  Acute blood loss anemia  -Hemoglobin drop from 11.6 and 7.2 , now improving  -Most likely lost blood subsequent to the fall, he has a big bruise in his right flank  -S/P transfusion of 1 unit of packed RBCs, follow CBC  Fall:  - Frequent falls due to NPH, dementia; cognitive impairment, gait disturbance, and urinary incontinence  - s/p VP shunt, likely not helpful since progressive disease;  - hold amitriptyline.   HTN:  - stable; cont home regimen   Probable UTI, leukocytosis;  - ? Treated at SNF:  - start empiric atx, negative cultures.  CKD stage III:  -Stable, around his baseline.  Code Status: DNR  Family Communication: d/w patient, family, Versie Starks [facility] , [place]   Disposition Plan: Transferred to step down for Cardizem drip  Consultants:  Ortho Procedures:  S/p Orthopedic Antibiotics:  none    Objective: Filed Vitals:   08/06/13 0400 08/06/13 0419 08/06/13 0500 08/06/13 0757  BP:  122/68  124/61  Pulse:  80  52  Temp:  98.6 F (37 C)  98.4 F (36.9 C)  TempSrc:  Oral  Axillary  Resp: 12 9  18   Height:      Weight:   74.7 kg (164 lb 10.9 oz)   SpO2: 97% 97%  98%    Intake/Output Summary (Last 24 hours) at 08/06/13 1000 Last data filed at 08/06/13 0802  Gross per 24 hour  Intake    320 ml  Output   1850 ml  Net  -1530 ml    Exam:  General: in no acute distress.  HEENT: No bruits, no goiter.  Heart: Regular rate and rhythm.  Lungs: Good air movement, clear  Abdomen: Soft, nontender, nondistended, positive bowel sounds.  Neuro: Grossly intact, nonfocal    Data Reviewed: Basic Metabolic Panel:  Recent Labs Lab 07/31/13 1000 08/01/13 0650 08/02/13 0432 08/04/13 0638 08/06/13 0655  NA 137 137 138 141 134*  K 3.8 4.5 4.1 3.8 4.6  CL 99 98 102 103 102  CO2 23 23 22 22 21   GLUCOSE 164* 115* 161* 100* 98  BUN 28* 40* 44* 42* 35*  CREATININE 1.83* 2.05* 2.07* 1.59* 1.52*  CALCIUM 9.0 8.8 8.6 8.6 8.7    Liver Function Tests:  Recent Labs Lab 07/31/13 1000 08/01/13 0650 08/04/13 0638  AST 15 73*  69*  ALT 10 17 32  ALKPHOS 74 60 55  BILITOT 0.4 0.6 0.9  PROT 7.0 6.5 6.6  ALBUMIN 3.4* 3.1* 2.6*   No results found for this basename: LIPASE, AMYLASE,  in the last 168 hours No results found for this basename: AMMONIA,  in the last 168 hours  CBC:  Recent Labs Lab 07/31/13 1000 08/01/13 0650 08/02/13 0432 08/03/13 0440 08/04/13 0638 08/06/13 0655  WBC 14.1* 13.3* 13.3* 11.0* 9.4 8.5  NEUTROABS 12.4*  --   --   --   --   --   HGB 11.4* 8.5* 7.5* 7.2* 8.7* 8.7*  HCT 32.7* 25.6* 21.2* 21.2* 25.7* 26.0*  MCV 88.1 91.8 88.7 90.6 87.1 89.0  PLT 304 257 241 233 276 342    Cardiac Enzymes: No results  found for this basename: CKTOTAL, CKMB, CKMBINDEX, TROPONINI,  in the last 168 hours BNP (last 3 results) No results found for this basename: PROBNP,  in the last 8760 hours   CBG: No results found for this basename: GLUCAP,  in the last 168 hours  Recent Results (from the past 240 hour(s))  URINE CULTURE     Status: None   Collection Time    07/31/13 10:21 PM      Result Value Ref Range Status   Specimen Description URINE, RANDOM   Final   Special Requests NONE   Final   Culture  Setup Time     Final   Value: 08/01/2013 10:02     Performed at [facility]     Final   Value: NO GROWTH     Performed at [facility]   Culture     Final   Value: NO GROWTH     Performed at [facility]   Report Status 08/02/2013 FINAL   Final  CULTURE, BLOOD (ROUTINE X 2)     Status: None   Collection Time    08/02/13  3:00 PM      Result Value Ref Range Status   Specimen Description BLOOD RIGHT ARM   Final   Special Requests BOTTLES DRAWN AEROBIC AND ANAEROBIC 10CC   Final   Culture  Setup Time     Final   Value: 08/02/2013 19:06     Performed at [facility]   Culture     Final   Value:        BLOOD CULTURE RECEIVED NO GROWTH TO DATE CULTURE WILL BE HELD FOR 5 DAYS BEFORE ISSUING A FINAL NEGATIVE REPORT     Performed at [facility]   Report Status PENDING   Incomplete  CULTURE, BLOOD (ROUTINE X 2)     Status: None   Collection Time    08/02/13  3:15 PM      Result Value Ref Range Status   Specimen Description BLOOD LEFT HAND   Final   Special Requests BOTTLES DRAWN AEROBIC AND ANAEROBIC 10CC   Final   Culture  Setup Time     Final   Value: 08/02/2013 19:07     Performed at [facility]   Culture     Final   Value:        BLOOD CULTURE RECEIVED NO GROWTH TO DATE CULTURE WILL BE HELD FOR 5 DAYS BEFORE ISSUING A FINAL NEGATIVE REPORT     Performed at [facility]   Report Status PENDING   Incomplete  MRSA PCR  SCREENING  Status: None   Collection Time    08/04/13  7:11 PM      Result Value Ref Range Status   MRSA by PCR NEGATIVE  NEGATIVE Final   Comment:            The GeneXpert MRSA Assay (FDA     approved for NASAL specimens     only), is one component of a     comprehensive MRSA colonization     surveillance program. It is not     intended to diagnose MRSA     infection nor to guide or     monitor treatment for     MRSA infections.     Studies: Dg Chest 1 View  07/31/2013   CLINICAL DATA:  fall, hip pain  EXAM: CHEST - 1 VIEW  COMPARISON:  07/10/2012  FINDINGS: VP shunt tubing partially seen over the right hemi thorax. Lungs clear. The mild edema suspected on previous study has resolved. Heart size normal. No effusion. Regional bones unremarkable.  IMPRESSION: 1. No acute disease   Electronically Signed   By: Arne Cleveland M.D.   On: 07/31/2013 09:42   Dg Hip Complete Right  07/31/2013   CLINICAL DATA:  HIP PAIN FALL  EXAM: RIGHT HIP - COMPLETE 2+ VIEW  COMPARISON:  07/21/2003  FINDINGS: There is a comminuted intertrochanteric fracture of the right femur. There are several cm displacement and foreshortening of major fracture fragments. The lesser trochanter is a separate fracture fragment. Femoral head remains located.  IMPRESSION: 1. Comminuted displaced right intertrochanteric femur fracture.   Electronically Signed   By: Arne Cleveland M.D.   On: 07/31/2013 09:40   Dg Femur Right  07/31/2013   CLINICAL DATA:  79 year old male -ORIF right femur fracture.  EXAM: RIGHT FEMUR - 2 VIEW; DG C-ARM 61-120 MIN  COMPARISON:  07/31/2013  FINDINGS: Intraoperative spot films of the right femur are submitted postoperatively for interpretation.  Nail and screw fixation of an intertrochanteric right femur fracture identified. The fracture is in near-anatomic alignment and position.  No complicating features are identified.  IMPRESSION: Internal fixation of intertrochanteric right femur fracture.    Electronically Signed   By: Hassan Rowan M.D.   On: 07/31/2013 17:35   Dg Femur Right  07/31/2013   CLINICAL DATA:  HIP PAIN FALL  EXAM: RIGHT FEMUR - 2 VIEW  COMPARISON:  07/21/2003  FINDINGS: Comminuted intertrochanteric fracture of the right femur. Several cm of displacement and override of major fracture fragments. The lesser trochanter is a separate fracture fragment. The femoral head remains seated. Distal femur is intact. Patchy femoral-popliteal arterial calcifications.  IMPRESSION: 1. Comminuted displaced intertrochanteric fracture   Electronically Signed   By: Arne Cleveland M.D.   On: 07/31/2013 09:41   Ct Head Wo Contrast  07/31/2013   CLINICAL DATA:  Fall ; dementia  EXAM: CT HEAD WITHOUT CONTRAST  CT CERVICAL SPINE WITHOUT CONTRAST  COMPARISON:  Brain CT July 04, 2013  TECHNIQUE: Multidetector CT imaging of the head and cervical spine was performed following the standard protocol without intravenous contrast. Multiplanar CT image reconstructions of the cervical spine were also generated.  FINDINGS: CT HEAD: There is diffuse ventricular enlargement with less pronounced sulcal enlargement, stable. There is a shunt catheter with the tip in the right lateral ventricle, stable. There is no intracranial mass, hemorrhage, extra-axial fluid collection or midline shift. There is decreased attenuation surrounding the lateral ventricles, stable. There are small lacunar type infarcts in the lateral left  lentiform nucleus as well as in both external capsules, stable. No new gray-white compartment lesion. No acute infarct.  Bony calvarium appears intact except for a shunt catheter placement defect in the anterior right parietal bone. Mastoid air cells are clear. There is ethmoid sinus disease bilaterally.  CT CERVICAL SPINE: There is no fracture or spondylolisthesis. Prevertebral soft tissues and predental space regions are normal. There is moderate disc space narrowing at C4-5, C5-6, and C6-7. There is bony  hypertrophy at most levels with multiple areas of as exit foraminal narrowing due to facet hypertrophy. No disc extrusion or stenosis. There is calcification in both carotid arteries.  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 Stable ventricular enlargement with shunt catheter in place, stable from prior study. Decreased attenuation adjacent to the ventricles is most likely due to a combination of small vessel disease and interstitial edema from the chronic ventricular enlargement. No acute infarct apparent. No hemorrhage or mass effect. There is ethmoid sinus disease bilaterally.  CT cervical spine: Multilevel spondylosis and osteoarthritic change. No fracture or spondylolisthesis. Calcification in both carotid arteries.   Electronically Signed   By: Lowella Grip M.D.   On: 07/31/2013 10:02   Ct Cervical Spine Wo Contrast  07/31/2013   CLINICAL DATA:  Fall ; dementia  EXAM: CT HEAD WITHOUT CONTRAST  CT CERVICAL SPINE WITHOUT CONTRAST  COMPARISON:  Brain CT July 04, 2013  TECHNIQUE: Multidetector CT imaging of the head and cervical spine was performed following the standard protocol without intravenous contrast. Multiplanar CT image reconstructions of the cervical spine were also generated.  FINDINGS: CT HEAD: There is diffuse ventricular enlargement with less pronounced sulcal enlargement, stable. There is a shunt catheter with the tip in the right lateral ventricle, stable. There is no intracranial mass, hemorrhage, extra-axial fluid collection or midline shift. There is decreased attenuation surrounding the lateral ventricles, stable. There are small lacunar type infarcts in the lateral left lentiform nucleus as well as in both external capsules, stable. No new gray-white compartment lesion. No acute infarct.  Bony calvarium appears intact except for a shunt catheter placement defect in the anterior right parietal bone. Mastoid air cells are clear.  There is ethmoid sinus disease bilaterally.  CT CERVICAL SPINE: There is no fracture or spondylolisthesis. Prevertebral soft tissues and predental space regions are normal. There is moderate disc space narrowing at C4-5, C5-6, and C6-7. There is bony hypertrophy at most levels with multiple areas of as exit foraminal narrowing due to facet hypertrophy. No disc extrusion or stenosis. There is calcification in both carotid arteries.  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 Stable ventricular enlargement with shunt catheter in place, stable from prior study. Decreased attenuation adjacent to the ventricles is most likely due to a combination of small vessel disease and interstitial edema from the chronic ventricular enlargement. No acute infarct apparent. No hemorrhage or mass effect. There is ethmoid sinus disease bilaterally.  CT cervical spine: Multilevel spondylosis and osteoarthritic change. No fracture or spondylolisthesis. Calcification in both carotid arteries.   Electronically Signed   By: Lowella Grip M.D.   On: 07/31/2013 10:02   Dg Chest Port 1 View  08/03/2013   CLINICAL DATA:  Fever, pneumonia, postop  EXAM: PORTABLE CHEST - 1 VIEW  COMPARISON:  07/25/2013  FINDINGS: Cardiomediastinal silhouette is stable. Right VP shunt catheter is unchanged in position. No segmental infiltrate or pulmonary edema. Central mild vascular congestion.  IMPRESSION: No segmental infiltrate or pulmonary edema.  Central mild vascular congestion.   Electronically Signed   By: Lahoma Crocker M.D.   On: 08/03/2013 11:38   Dg Chest Port 1 View  08/02/2013   CLINICAL DATA:  Fever.  EXAM: PORTABLE CHEST - 1 VIEW  COMPARISON:  Chest radiograph 07/31/2013.  FINDINGS: Patient is rotated. Stable enlarged cardiac and mediastinal contours. Low lung volumes. No consolidative pulmonary opacities. No pleural effusion or pneumothorax. Showing catheter overlies the right hemi thorax.   IMPRESSION: No acute cardiopulmonary process.   Electronically Signed   By: Lovey Newcomer M.D.   On: 08/02/2013 18:26   Dg C-arm 1-60 Min  07/31/2013   CLINICAL DATA:  79 year old male -ORIF right femur fracture.  EXAM: RIGHT FEMUR - 2 VIEW; DG C-ARM 61-120 MIN  COMPARISON:  07/31/2013  FINDINGS: Intraoperative spot films of the right femur are submitted postoperatively for interpretation.  Nail and screw fixation of an intertrochanteric right femur fracture identified. The fracture is in near-anatomic alignment and position.  No complicating features are identified.  IMPRESSION: Internal fixation of intertrochanteric right femur fracture.   Electronically Signed   By: Hassan Rowan M.D.   On: 07/31/2013 17:35    Scheduled Meds: . ceFEPime (MAXIPIME) IV  1 g Intravenous Q12H  . diltiazem  360 mg Oral Daily  . docusate sodium  100 mg Oral BID  . enoxaparin (LOVENOX) injection  40 mg Subcutaneous Q24H  . levothyroxine  150 mcg Oral QAC breakfast  . pantoprazole  40 mg Oral Daily  . senna  2 tablet Oral BID  . sertraline  100 mg Oral Daily  . vancomycin  750 mg Intravenous Q24H   Continuous Infusions: . sodium chloride Stopped (08/05/13 1400)    Principal Problem:   Closed intertrochanteric fracture of hip Active Problems:   Dementia   Falls   Normal pressure hydrocephalus   Agitation   Hip fracture    Time spent: 40 minutes   [facility] Pager [phone]. If 8PM-8AM, please contact night-coverage at [email or web address], password [facility] 08/06/2013, 10:00 AM  LOS: 6 days

## 2013-08-07 DIAGNOSIS — I10 Essential (primary) hypertension: Secondary | ICD-10-CM

## 2013-08-07 LAB — CREATININE, SERUM
Creatinine, Ser: 1.71 mg/dL — ABNORMAL HIGH (ref 0.50–1.35)
GFR calc Af Amer: 43 mL/min — ABNORMAL LOW (ref >90)
GFR, EST NON AFRICAN AMERICAN: 37 mL/min — AB (ref >90)

## 2013-08-07 MED ORDER — HALOPERIDOL LACTATE 5 MG/ML IJ SOLN
5.0000 mg | Freq: Four times a day (QID) | INTRAMUSCULAR | Status: DC | PRN
  Administered 2013-08-08: 5 mg via INTRAVENOUS
  Filled 2013-08-07: qty 1

## 2013-08-07 NOTE — Progress Notes (Signed)
[facility] PROGRESS NOTE  Kippy Gohman Bryand OIN:[id number] DOB: 22-Feb-1934 DOA: 07/31/2013 PCP: Marjorie Smolder, MD  Assessment/Plan: Atrial Flutter  -New onset, on Cardizem drip -Cardiology following -On PRN Lopressor -Off gtt and on Cardizem 360 CD daily -HR better controlled -Not candidate for full dose anticoagulation per Cardiology recs Closed intertrochanteric fracture of hip  -S/p ORIF on 7.26.2015.  -Avoid narcotics use haldol for agitation.  -Tylenol for pain.  -Awaiting SNF placement.  Unknown source Fever:  -Developed fever of of 102.1 on 7/28.  -CRX, UA and blood culture are negative.  -Suspected secondary to aspiration, continue vancomycin and cefepime empirically  -Pt on lovenox -Tylenol for fever.  -Dys 1 diet Hypothyroidism  -Elevated TSH of 8.050, patient initially on levothyroxine at 150.  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  - SNF pending HTN:  - stable; cont home regimen  Probable UTI, leukocytosis;  - ? Treated at SNF:  - On empiric atx, negative cultures.  CKD stage III:  -Stable, around his baseline.  Code Status: DNR Family Communication: Pt in room Disposition Plan: SNF   Consultants:  Cardiology  Ortho  Palliative Care  Procedures:  ORIF R hip fx 7/26  Antibiotics:  Vanc 7/28>>>  Cefepime 7/28>>>   HPI/Subjective: No acute events noted. Pt without complaints  Objective: Filed Vitals:   08/06/13 2059 08/06/13 2310 08/07/13 0315 08/07/13 0500  BP: 101/58 119/80 123/65   Pulse: 106 96 88    Temp: 98 F (36.7 C) 98.4 F (36.9 C) 98.4 F (36.9 C)   TempSrc: Oral Oral Oral   Resp: 23 18 17    Height:      Weight:    72.7 kg (160 lb 4.4 oz)  SpO2: 98% 98% 94%     Intake/Output Summary (Last 24 hours) at 08/07/13 0741 Last data filed at 08/07/13 0318  Gross per 24 hour  Intake    150 ml  Output   2125 ml  Net  -1975 ml   Filed Weights   08/04/13 0428 08/06/13 0500 08/07/13 0500  Weight: 77 kg (169 lb 12.1 oz) 74.7 kg (164 lb 10.9 oz) 72.7 kg (160 lb 4.4 oz)    Exam:   General:  Awake, in nad  Cardiovascular: regular, s1, s2  Respiratory: normal resp effort, no wheezing  Abdomen: soft,nondistended  Musculoskeletal: perfused, no clubbing   Data Reviewed: Basic Metabolic Panel:  Recent Labs Lab 07/31/13 1000 08/01/13 0650 08/02/13 0432 08/04/13 0638 08/06/13 0655 08/07/13 0245  NA 137 137 138 141 134*  --   K 3.8 4.5 4.1 3.8 4.6  --   CL 99 98 102 103 102  --   CO2 23 23 22 22 21   --   GLUCOSE 164* 115* 161* 100* 98  --   BUN 28* 40* 44* 42* 35*  --   CREATININE 1.83* 2.05* 2.07* 1.59* 1.52* 1.71*  CALCIUM 9.0 8.8 8.6 8.6 8.7  --    Liver Function Tests:  Recent Labs Lab 07/31/13 1000 08/01/13 0650 08/04/13 0638  AST 15  73* 69*  ALT 10 17 32  ALKPHOS 74 60 55  BILITOT 0.4 0.6 0.9  PROT 7.0 6.5 6.6  ALBUMIN 3.4* 3.1* 2.6*   No results found for this basename: LIPASE, AMYLASE,  in the last 168 hours No results found for this basename: AMMONIA,  in the last 168 hours CBC:  Recent Labs Lab 07/31/13 1000 08/01/13 0650 08/02/13 0432 08/03/13 0440 08/04/13 0638 08/06/13 0655  WBC 14.1* 13.3* 13.3* 11.0* 9.4 8.5  NEUTROABS 12.4*  --   --   --   --   --   HGB 11.4* 8.5* 7.5* 7.2* 8.7* 8.7*  HCT 32.7* 25.6* 21.2* 21.2* 25.7* 26.0*  MCV 88.1 91.8 88.7 90.6 87.1 89.0  PLT 304 257 241 233 276 342   Cardiac Enzymes: No results found for this basename: CKTOTAL, CKMB, CKMBINDEX, TROPONINI,  in the last 168 hours BNP (last 3  results) No results found for this basename: PROBNP,  in the last 8760 hours CBG: No results found for this basename: GLUCAP,  in the last 168 hours  Recent Results (from the past 240 hour(s))  URINE CULTURE     Status: None   Collection Time    07/31/13 10:21 PM      Result Value Ref Range Status   Specimen Description URINE, RANDOM   Final   Special Requests NONE   Final   Culture  Setup Time     Final   Value: 08/01/2013 10:02     Performed at [facility] Count     Final   Value: NO GROWTH     Performed at [facility]   Culture     Final   Value: NO GROWTH     Performed at [facility]   Report Status 08/02/2013 FINAL   Final  CULTURE, BLOOD (ROUTINE X 2)     Status: None   Collection Time    08/02/13  3:00 PM      Result Value Ref Range Status   Specimen Description BLOOD RIGHT ARM   Final   Special Requests BOTTLES DRAWN AEROBIC AND ANAEROBIC 10CC   Final   Culture  Setup Time     Final   Value: 08/02/2013 19:06     Performed at [facility]   Culture     Final   Value:        BLOOD CULTURE RECEIVED NO GROWTH TO DATE CULTURE WILL BE HELD FOR 5 DAYS BEFORE ISSUING A FINAL NEGATIVE REPORT     Performed at [facility]   Report Status PENDING   Incomplete  CULTURE, BLOOD (ROUTINE X 2)     Status: None   Collection Time    08/02/13  3:15 PM      Result Value Ref Range Status   Specimen Description BLOOD LEFT HAND   Final   Special Requests BOTTLES DRAWN AEROBIC AND ANAEROBIC 10CC   Final   Culture  Setup Time     Final   Value: 08/02/2013 19:07     Performed at [facility]   Culture     Final   Value:        BLOOD CULTURE RECEIVED NO GROWTH TO DATE CULTURE WILL BE HELD FOR 5 DAYS BEFORE ISSUING A FINAL NEGATIVE REPORT     Performed at [facility]   Report Status PENDING   Incomplete  MRSA PCR SCREENING     Status: None  Collection Time    08/04/13  7:11 PM      Result Value Ref Range Status    MRSA by PCR NEGATIVE  NEGATIVE Final   Comment:            The GeneXpert MRSA Assay (FDA     approved for NASAL specimens     only), is one component of a     comprehensive MRSA colonization     surveillance program. It is not     intended to diagnose MRSA     infection nor to guide or     monitor treatment for     MRSA infections.     Studies: No results found.  Scheduled Meds: . ceFEPime (MAXIPIME) IV  1 g Intravenous Q12H  . diltiazem  360 mg Oral Daily  . docusate sodium  100 mg Oral BID  . enoxaparin (LOVENOX) injection  40 mg Subcutaneous Q24H  . levothyroxine  175 mcg Oral QAC breakfast  . pantoprazole  40 mg Oral Daily  . senna  2 tablet Oral BID  . sertraline  100 mg Oral Daily  . vancomycin  1,000 mg Intravenous Q18H   Continuous Infusions: . sodium chloride Stopped (08/05/13 1400)    Principal Problem:   Closed intertrochanteric fracture of hip Active Problems:   Dementia   Falls   Normal pressure hydrocephalus   Agitation   Hip fracture  Time spent: 38min  Brynn Mulgrew, [facility] Pager [phone]. If 7PM-7AM, please contact night-coverage at [email or web address], password [facility] 08/07/2013, 7:41 AM  LOS: 7 days

## 2013-08-07 NOTE — Progress Notes (Signed)
Subjective:  He is awake with no complaints of shortness of breath or chest pain. Wants to get out of the bed.  Objective:  Vital Signs in the last 24 hours: BP 144/92  Pulse 87  Temp(Src) 98.3 F (36.8 C) (Oral)  Resp 19  Ht 5' 8.11" (1.73 m)  Wt 72.7 kg (160 lb 4.4 oz)  BMI 24.29 kg/m2  SpO2 96%  Physical Exam: Elderly male who is sleeping at the present time Lungs:  Clear  Cardiac:  Regular rhythm with irregular beats, normal S1 and S2, no S3 Extremities:  No edema present  Intake/Output from previous day: 08/01 0701 - 08/02 0700 In: 150 [IV Piggyback:150] Out: 2125 [Urine:2125] Weight Filed Weights   08/04/13 0428 08/06/13 0500 08/07/13 0500  Weight: 77 kg (169 lb 12.1 oz) 74.7 kg (164 lb 10.9 oz) 72.7 kg (160 lb 4.4 oz)    Lab Results: Basic Metabolic Panel:  Recent Labs  08/06/13 0655 08/07/13 0245  NA 134*  --   K 4.6  --   CL 102  --   CO2 21  --   GLUCOSE 98  --   BUN 35*  --   CREATININE 1.52* 1.71*    CBC:  Recent Labs  08/06/13 0655  WBC 8.5  HGB 8.7*  HCT 26.0*  MCV 89.0  PLT 342   PROTIME: Lab Results  Component Value Date   INR 1.24 08/01/2013   INR 1.07 07/31/2013    Telemetry: Currently sinus rhythm with PACs, episodes of nonsustained atrial fibrillation noted.  Assessment/Plan:  1. Paroxysmal atrial flutter and fibrillation triggered due to stress of surgery and anemia not thought to be a candidate for anticoagulation 2. Severe dementia 3. Recent hip surgery   Recommendations:  Continue current care  W. Doristine Church  MD [facility]  08/07/2013, 11:33 AM

## 2013-08-08 DIAGNOSIS — I4891 Unspecified atrial fibrillation: Secondary | ICD-10-CM

## 2013-08-08 LAB — CULTURE, BLOOD (ROUTINE X 2)
Culture: NO GROWTH
Culture: NO GROWTH

## 2013-08-08 MED ORDER — AMOXICILLIN-POT CLAVULANATE 875-125 MG PO TABS: 1.0000 | ORAL_TABLET | Freq: Two times a day (BID) | ORAL | Status: AC

## 2013-08-08 MED ORDER — LEVOTHYROXINE SODIUM 175 MCG PO TABS: 175.0000 ug | ORAL_TABLET | Freq: Every day | ORAL | Status: AC

## 2013-08-08 MED ORDER — HALOPERIDOL 5 MG PO TABS: 5.0000 mg | ORAL_TABLET | Freq: Three times a day (TID) | ORAL | Status: AC | PRN

## 2013-08-08 MED ORDER — DILTIAZEM HCL ER COATED BEADS 360 MG PO CP24: 360.0000 mg | ORAL_CAPSULE | Freq: Every day | ORAL | Status: AC

## 2013-08-08 NOTE — Progress Notes (Signed)
Unable to properly assess psychosocial status in admission history.

## 2013-08-08 NOTE — Progress Notes (Signed)
Subjective: The patient is resting but arousable.  Objective: Vital signs in last 24 hours: Temp:  [97.7 F (36.5 C)-98.7 F (37.1 C)] 98.2 F (36.8 C) (08/03 0800) Pulse Rate:  [59-92] 88 (08/03 0800) Resp:  [14-23] 14 (08/03 0800) BP: (105-152)/(69-92) 152/92 mmHg (08/03 0800) SpO2:  [97 %-100 %] 97 % (08/03 0800) Last BM Date: 08/07/13  Intake/Output from previous day: 08/02 0701 - 08/03 0700 In: 540 [P.O.:240; IV Piggyback:300] Out: 2400 [Urine:2400] Intake/Output this shift: Total I/O In: -  Out: 450 [Urine:450]  Medications Current Facility-Administered Medications  Medication Dose Route Frequency Provider Last Rate Last Dose  . acetaminophen (TYLENOL) tablet 650 mg  650 mg Oral Q6H PRN [name], MD   650 mg at 08/03/13 1204   Or  . acetaminophen (TYLENOL) suppository 650 mg  650 mg Rectal Q6H PRN [name], MD      . ceFEPIme (MAXIPIME) 1 g in dextrose 5 % 50 mL IVPB  1 g Intravenous Q12H Charlynne Cousins, MD   1 g at 08/08/13 0319  . diltiazem (CARDIZEM CD) 24 hr capsule 360 mg  360 mg Oral Daily Burnell Blanks, MD   360 mg at 08/08/13 0953  . docusate sodium (COLACE) capsule 100 mg  100 mg Oral BID [name], MD   100 mg at 08/07/13 4259  . enoxaparin (LOVENOX) injection 40 mg  40 mg Subcutaneous Q24H Kinnie Feil, MD   40 mg at 08/08/13 0953  . haloperidol lactate (HALDOL) injection 5 mg  5 mg Intravenous Q6H PRN Ritta Slot, NP   5 mg at 08/08/13 5638  . levothyroxine (SYNTHROID, LEVOTHROID) tablet 175 mcg  175 mcg Oral QAC breakfast Verlee Monte, MD   175 mcg at 08/08/13 0953  . menthol-cetylpyridinium (CEPACOL) lozenge 3 mg  1 lozenge Oral PRN [name], MD       Or  . phenol (CHLORASEPTIC) mouth spray 1 spray  1 spray Mouth/Throat PRN [name], MD      . ondansetron [facility]) tablet 4 mg  4 mg Oral Q6H PRN Theodis Blaze, MD       Or  . ondansetron [facility]) injection 4 mg  4 mg Intravenous Q6H PRN Theodis Blaze, MD   4 mg at  08/08/13 0326  . pantoprazole (PROTONIX) EC tablet 40 mg  40 mg Oral Daily Theodis Blaze, MD   40 mg at 08/07/13 7564  . risperiDONE (RISPERDAL) tablet 0.5 mg  0.5 mg Oral BID PRN Charlynne Cousins, MD   0.5 mg at 08/07/13 1629  . senna (SENOKOT) tablet 17.2 mg  2 tablet Oral BID [name], MD   17.2 mg at 08/07/13 3329  . sertraline (ZOLOFT) tablet 100 mg  100 mg Oral Daily Theodis Blaze, MD   100 mg at 08/08/13 0953  . vancomycin (VANCOCIN) IVPB 1000 mg/200 mL premix  1,000 mg Intravenous Q18H Georgina Peer, RPH   1,000 mg at 08/08/13 0200    PE: General appearance: alert and no distress Lungs: clear to auscultation bilaterally Heart: regular rate and rhythm, S1, S2 normal, no murmur, click, rub or gallop Extremities: No LEE Pulses: 2+ and symmetric Skin: Warm and dry Neurologic: Grossly normal  Lab Results:   Recent Labs  08/06/13 0655  WBC 8.5  HGB 8.7*  HCT 26.0*  PLT 342   BMET  Recent Labs  08/06/13 0655 08/07/13 0245  NA 134*  --   K 4.6  --  CL 102  --   CO2 21  --   GLUCOSE 98  --   BUN 35*  --   CREATININE 1.52* 1.71*  CALCIUM 8.7  --     Assessment/Plan  Principal Problem:   Closed intertrochanteric fracture of hip Active Problems:   Dementia   Falls   Normal pressure hydrocephalus   Agitation   Hip fracture    PAF  Plan:   Continues to have short episodes of a-fib but mostly is in SR with frequent PVCs.  On Cardizem 360mg .  Not an anticoagulation candidate.      LOS: 8 days    HAGER, BRYAN PA-C 08/08/2013 11:34 AM  Patient is demented. Denies any chest pain or dyspnea. Rhythm is NSR with occasional short bursts of atrial fibrillation, asymptomatic. Continue rate control with diltiazem. Lungs are clear. Heart reveals no gallop or murmur.

## 2013-08-08 NOTE — Progress Notes (Signed)
Physical Therapy Treatment Patient Details Name: Walter Carey MRN: [id number] DOB: 06-17-1934 Today's Date: 08/08/2013    History of Present Illness pt presents with R hip fx s/p IM nail.      PT Comments    Patient tolerated EOB >10 minutes, PROM activity performed, attempted EOB balance and sit<> stand but patient agitated and unable to complete attempt. Will continue to see and progress as tolerated.  Follow Up Recommendations  SNF     Equipment Recommendations   (TBD)    Recommendations for Other Services       Precautions / Restrictions Precautions Precautions: Fall Restrictions Weight Bearing Restrictions: Yes RLE Weight Bearing: Weight bearing as tolerated    Mobility  Bed Mobility Overal bed mobility: Needs Assistance;+2 for physical assistance Bed Mobility: Supine to Sit;Sit to Supine     Supine to sit: Max assist;+2 for physical assistance;HOB elevated Sit to supine: Max assist;+2 for physical assistance;HOB elevated   General bed mobility comments: Pt could not get to EOB but was able to roll back and forth in bed to allow [place] to change his sheets with +2 for physical assistance.  Transfers                 General transfer comment: Pt unable to perform transfer  Ambulation/Gait                 Stairs            Wheelchair Mobility    Modified Rankin (Stroke Patients Only)       Balance Overall balance assessment: Needs assistance Sitting-balance support: Bilateral upper extremity supported;Feet supported Sitting balance-Leahy Scale: Poor Sitting balance - Comments: pt with R lateral lean.                              Cognition Arousal/Alertness: Lethargic Behavior During Therapy: Agitated Overall Cognitive Status: Impaired/Different from baseline Area of Impairment: Attention;Memory;Following commands;Safety/judgement;Awareness;Problem solving   Current Attention Level: Focused Memory: Decreased recall of  precautions;Decreased short-term memory Following Commands: Follows one step commands inconsistently;Follows one step commands with increased time Safety/Judgement: Decreased awareness of safety;Decreased awareness of deficits Awareness: Intellectual Problem Solving: Slow processing;Decreased initiation;Difficulty sequencing;Requires verbal cues;Requires tactile cues      Exercises General Exercises - Lower Extremity Ankle Circles/Pumps: PROM;Both;10 reps Long Arc Quad: PROM;Both;10 reps (extremely limited ROM)    General Comments General comments (skin integrity, edema, etc.): significant bruising Right Flank, nsg aware      Pertinent Vitals/Pain Appears to have pain with wincing and vocalization with movement no value given    Home Living                      Prior Function            PT Goals (current goals can now be found in the care plan section) Acute Rehab PT Goals Patient Stated Goal: none stated PT Goal Formulation: Patient unable to participate in goal setting Time For Goal Achievement: 08/15/13 Potential to Achieve Goals: Fair Progress towards PT goals: Progressing toward goals (very modestly, tolerated EOB >10 mins)    Frequency  Min 2X/week    PT Plan      Co-evaluation             End of Session   Activity Tolerance: Treatment limited secondary to agitation;Patient limited by pain Patient left: in bed;with call bell/phone within reach;with nursing/sitter in room (RN  stated will set bed alarm)     Time: 7340-3709 PT Time Calculation (min): 24 min  Charges:  $Therapeutic Activity: 23-37 mins                    G CodesDuncan Dull Aug 25, 2013, 11:43 AM Alben Deeds, PT DPT  [phone]

## 2013-08-08 NOTE — Discharge Summary (Signed)
Physician Discharge Summary  Walter Carey SWN:[id number] DOB: Oct 12, 1934 DOA: 07/31/2013  PCP: Marjorie Smolder, MD  Admit date: 07/31/2013 Discharge date: 08/08/2013  Time spent: 35 minutes  Recommendations for Outpatient Follow-up:  1. SNF- [facility] 2. DYS 1 thin 3. augmentin until 8/13 4. DNR 5. Haldol PRN for agitation 6.  Discharge Diagnoses:  Principal Problem:   Closed intertrochanteric fracture of hip Active Problems:   Dementia   Falls   Normal pressure hydrocephalus   Agitation   Hip fracture   Discharge Condition: stable  Diet recommendation: DYS 1 thin  Filed Weights   08/04/13 0428 08/06/13 0500 08/07/13 0500  Weight: 77 kg (169 lb 12.1 oz) 74.7 kg (164 lb 10.9 oz) 72.7 kg (160 lb 4.4 oz)    History of present illness:  Walter Carey is a 79 y.o. male with PMH of HTN, CKD, hypothyroidism, h/o prostate CA, dementia with NPH s/p VP shunting, frequent falls, prn wheelchair for the last few years presented from SNF another episode of fall, found to have hip fracture; He was found on the floor in his room, complaining pain in his right hip area. Patient was stabilized by EMS and transported to the ED; Pt denies SOB, no chest pain, no focal neuro weakness, no LOC, no nausea, vomiting or diarrhea, no fever,chills;  -ED, Dr. Tomi Bamberger d/w ortho,who is planning to takle him to OR today, requested to TF to [facility] Course:  Atrial Flutter   Cardizem 360 CD daily  -HR better controlled  -Not candidate for full dose anticoagulation per Cardiology recs   Closed intertrochanteric fracture of hip  -S/p ORIF on 7.26.2015.  -Avoid narcotics use haldol for agitation.  -Tylenol for pain.  -Awaiting SNF placement.   Unknown source Fever:  -Developed fever of of 102.1 on 7/28.  -CRX, UA and blood culture are negative.  -Suspected secondary to aspiration, continue vancomycin and cefepime empirically  -Pt on lovenox  -Tylenol for fever.  -Dys 1 diet   Hypothyroidism   -Elevated TSH of 8.050, patient initially on levothyroxine at 150.  -Dose increased to 171mcg on 8/2   Acute blood loss anemia  -Hemoglobin drop from 11.6 and 7.2 , now improving  -Most likely lost blood subsequent to the fall, he has a big bruise in his right flank  -S/P transfusion of 1 unit of packed RBCs, follow CBC  -Current hgb of 8.7   Fall:  - Frequent falls due to NPH, dementia; cognitive impairment, gait disturbance, and urinary incontinence  - s/p VP shunt, likely not helpful since progressive disease;  - hold amitriptyline.  - SNF    HTN:  - stable; cont home regimen   Probable UTI, leukocytosis;  - ? Treated at SNF:  - On empiric atx, negative cultures.   CKD stage III:  -Stable, around his baseline.   Procedures:    Consultations:  ortho  Discharge Exam: Filed Vitals:   08/08/13 0800  BP: 152/92  Pulse: 88  Temp: 98.2 F (36.8 C)  Resp: 14    General: sleeping Cardiovascular: irr Respiratory: clear  Discharge Instructions You were cared for by a hospitalist during your hospital stay. If you have any questions about your discharge medications or the care you received while you were in the hospital after you are discharged, you can call the unit and asked to speak with the hospitalist on call if the hospitalist that took care of you is not available. Once you are discharged, your primary  care physician will handle any further medical issues. Please note that NO REFILLS for any discharge medications will be authorized once you are discharged, as it is imperative that you return to your primary care physician (or establish a relationship with a primary care physician if you do not have one) for your aftercare needs so that they can reassess your need for medications and monitor your lab values.  Discharge Instructions   Discharge instructions    Complete by:  As directed   augmentin until 8/13     Increase activity slowly    Complete by:  As directed       Weight bearing as tolerated    Complete by:  As directed   Laterality:  right  Extremity:  Lower            Medication List    STOP taking these medications       QUEtiapine 25 MG tablet  Commonly known as:  SEROQUEL      TAKE these medications       acetaminophen 325 MG tablet  Commonly known as:  TYLENOL  Take 2 tablets (650 mg total) by mouth every 6 (six) hours as needed for mild pain or moderate pain (or Fever >/= 101).     amitriptyline 25 MG tablet  Commonly known as:  ELAVIL  Take 25 mg by mouth at bedtime.     amoxicillin-clavulanate 875-125 MG per tablet  Commonly known as:  AUGMENTIN  Take 1 tablet by mouth 2 (two) times daily.     CALCIUM-VITAMIN D PO  Take 1 tablet by mouth daily.     diltiazem 360 MG 24 hr capsule  Commonly known as:  CARDIZEM CD  Take 1 capsule (360 mg total) by mouth daily.     donepezil 10 MG tablet  Commonly known as:  ARICEPT  Take 10 mg by mouth at bedtime.     DSS 100 MG Caps  Take 100 mg by mouth 2 (two) times daily.     enoxaparin 40 MG/0.4ML injection  Commonly known as:  LOVENOX  Inject 0.4 mLs (40 mg total) into the skin daily.     fish oil-omega-3 fatty acids 1000 MG capsule  Take 1 g by mouth 3 (three) times daily.     levothyroxine 175 MCG tablet  Commonly known as:  SYNTHROID, LEVOTHROID  Take 1 tablet (175 mcg total) by mouth daily before breakfast.     multivitamin with minerals Tabs tablet  Take 1 tablet by mouth daily.     omeprazole 20 MG capsule  Commonly known as:  PRILOSEC  Take 20 mg by mouth 2 (two) times daily.     risperiDONE 0.25 MG tablet  Commonly known as:  RISPERDAL  Take 1 tablet (0.25 mg total) by mouth 2 (two) times daily as needed (for agitation).     senna 8.6 MG Tabs tablet  Commonly known as:  SENOKOT  Take 2 tablets (17.2 mg total) by mouth 2 (two) times daily.     sertraline 100 MG tablet  Commonly known as:  ZOLOFT  Take 100 mg by mouth daily.     thiamine 100 MG  tablet  Commonly known as:  VITAMIN B-1  Take 300 mg by mouth daily.     vitamin E 400 UNIT capsule  Take 400 Units by mouth 2 (two) times daily.       Allergies  Allergen Reactions  . Asa [Aspirin]   . Erythromycin   .  Morphine And Related   . Oxycodone   . Stadol [Butorphanol]   . Tylox [Oxycodone-Acetaminophen]   . Versed [Midazolam]        Follow-up Information   Follow up with HEWITT, Jenny Reichmann, MD. Schedule an appointment as soon as possible for a visit in 2 weeks.   Specialty:  Orthopedic Surgery   Contact information:   [address] [place] [address] [place] [address] [phone]        The results of significant diagnostics from this hospitalization (including imaging, microbiology, ancillary and laboratory) are listed below for reference.    Significant Diagnostic Studies: Dg Chest 1 View  07/31/2013   CLINICAL DATA:  fall, hip pain  EXAM: CHEST - 1 VIEW  COMPARISON:  07/10/2012  FINDINGS: VP shunt tubing partially seen over the right hemi thorax. Lungs clear. The mild edema suspected on previous study has resolved. Heart size normal. No effusion. Regional bones unremarkable.  IMPRESSION: 1. No acute disease   Electronically Signed   By: Arne Cleveland M.D.   On: 07/31/2013 09:42   Dg Hip Complete Right  07/31/2013   CLINICAL DATA:  HIP PAIN FALL  EXAM: RIGHT HIP - COMPLETE 2+ VIEW  COMPARISON:  07/21/2003  FINDINGS: There is a comminuted intertrochanteric fracture of the right femur. There are several cm displacement and foreshortening of major fracture fragments. The lesser trochanter is a separate fracture fragment. Femoral head remains located.  IMPRESSION: 1. Comminuted displaced right intertrochanteric femur fracture.   Electronically Signed   By: Arne Cleveland M.D.   On: 07/31/2013 09:40   Dg Femur Right  07/31/2013   CLINICAL DATA:  78 year old male -ORIF right femur fracture.  EXAM: RIGHT FEMUR - 2 VIEW; DG C-ARM 61-120 MIN  COMPARISON:  07/31/2013   FINDINGS: Intraoperative spot films of the right femur are submitted postoperatively for interpretation.  Nail and screw fixation of an intertrochanteric right femur fracture identified. The fracture is in near-anatomic alignment and position.  No complicating features are identified.  IMPRESSION: Internal fixation of intertrochanteric right femur fracture.   Electronically Signed   By: Hassan Rowan M.D.   On: 07/31/2013 17:35   Dg Femur Right  07/31/2013   CLINICAL DATA:  HIP PAIN FALL  EXAM: RIGHT FEMUR - 2 VIEW  COMPARISON:  07/21/2003  FINDINGS: Comminuted intertrochanteric fracture of the right femur. Several cm of displacement and override of major fracture fragments. The lesser trochanter is a separate fracture fragment. The femoral head remains seated. Distal femur is intact. Patchy femoral-popliteal arterial calcifications.  IMPRESSION: 1. Comminuted displaced intertrochanteric fracture   Electronically Signed   By: Arne Cleveland M.D.   On: 07/31/2013 09:41   Ct Head Wo Contrast  07/31/2013   CLINICAL DATA:  Fall ; dementia  EXAM: CT HEAD WITHOUT CONTRAST  CT CERVICAL SPINE WITHOUT CONTRAST  COMPARISON:  Brain CT July 04, 2013  TECHNIQUE: Multidetector CT imaging of the head and cervical spine was performed following the standard protocol without intravenous contrast. Multiplanar CT image reconstructions of the cervical spine were also generated.  FINDINGS: CT HEAD: There is diffuse ventricular enlargement with less pronounced sulcal enlargement, stable. There is a shunt catheter with the tip in the right lateral ventricle, stable. There is no intracranial mass, hemorrhage, extra-axial fluid collection or midline shift. There is decreased attenuation surrounding the lateral ventricles, stable. There are small lacunar type infarcts in the lateral left lentiform nucleus as well as in both external capsules, stable. No new gray-white compartment lesion.  No acute infarct.  Bony calvarium appears intact  except for a shunt catheter placement defect in the anterior right parietal bone. Mastoid air cells are clear. There is ethmoid sinus disease bilaterally.  CT CERVICAL SPINE: There is no fracture or spondylolisthesis. Prevertebral soft tissues and predental space regions are normal. There is moderate disc space narrowing at C4-5, C5-6, and C6-7. There is bony hypertrophy at most levels with multiple areas of as exit foraminal narrowing due to facet hypertrophy. No disc extrusion or stenosis. There is calcification in both carotid arteries.  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 Stable ventricular enlargement with shunt catheter in place, stable from prior study. Decreased attenuation adjacent to the ventricles is most likely due to a combination of small vessel disease and interstitial edema from the chronic ventricular enlargement. No acute infarct apparent. No hemorrhage or mass effect. There is ethmoid sinus disease bilaterally.  CT cervical spine: Multilevel spondylosis and osteoarthritic change. No fracture or spondylolisthesis. Calcification in both carotid arteries.   Electronically Signed   By: Lowella Grip M.D.   On: 07/31/2013 10:02   Ct Cervical Spine Wo Contrast  07/31/2013   CLINICAL DATA:  Fall ; dementia  EXAM: CT HEAD WITHOUT CONTRAST  CT CERVICAL SPINE WITHOUT CONTRAST  COMPARISON:  Brain CT July 04, 2013  TECHNIQUE: Multidetector CT imaging of the head and cervical spine was performed following the standard protocol without intravenous contrast. Multiplanar CT image reconstructions of the cervical spine were also generated.  FINDINGS: CT HEAD: There is diffuse ventricular enlargement with less pronounced sulcal enlargement, stable. There is a shunt catheter with the tip in the right lateral ventricle, stable. There is no intracranial mass, hemorrhage, extra-axial fluid collection or midline shift. There is decreased attenuation  surrounding the lateral ventricles, stable. There are small lacunar type infarcts in the lateral left lentiform nucleus as well as in both external capsules, stable. No new gray-white compartment lesion. No acute infarct.  Bony calvarium appears intact except for a shunt catheter placement defect in the anterior right parietal bone. Mastoid air cells are clear. There is ethmoid sinus disease bilaterally.  CT CERVICAL SPINE: There is no fracture or spondylolisthesis. Prevertebral soft tissues and predental space regions are normal. There is moderate disc space narrowing at C4-5, C5-6, and C6-7. There is bony hypertrophy at most levels with multiple areas of as exit foraminal narrowing due to facet hypertrophy. No disc extrusion or stenosis. There is calcification in both carotid arteries.  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 Stable ventricular enlargement with shunt catheter in place, stable from prior study. Decreased attenuation adjacent to the ventricles is most likely due to a combination of small vessel disease and interstitial edema from the chronic ventricular enlargement. No acute infarct apparent. No hemorrhage or mass effect. There is ethmoid sinus disease bilaterally.  CT cervical spine: Multilevel spondylosis and osteoarthritic change. No fracture or spondylolisthesis. Calcification in both carotid arteries.   Electronically Signed   By: Lowella Grip M.D.   On: 07/31/2013 10:02   Dg Chest Port 1 View  08/03/2013   CLINICAL DATA:  Fever, pneumonia, postop  EXAM: PORTABLE CHEST - 1 VIEW  COMPARISON:  07/25/2013  FINDINGS: Cardiomediastinal silhouette is stable. Right VP shunt catheter is unchanged in position. No segmental infiltrate or pulmonary edema. Central mild vascular congestion.  IMPRESSION: No segmental infiltrate or pulmonary edema. Central mild vascular congestion.   Electronically Signed   By: Orlean Bradford.D.  On: 08/03/2013  11:38   Dg Chest Port 1 View  08/02/2013   CLINICAL DATA:  Fever.  EXAM: PORTABLE CHEST - 1 VIEW  COMPARISON:  Chest radiograph 07/31/2013.  FINDINGS: Patient is rotated. Stable enlarged cardiac and mediastinal contours. Low lung volumes. No consolidative pulmonary opacities. No pleural effusion or pneumothorax. Showing catheter overlies the right hemi thorax.  IMPRESSION: No acute cardiopulmonary process.   Electronically Signed   By: Lovey Newcomer M.D.   On: 08/02/2013 18:26   Dg C-arm 1-60 Min  07/31/2013   CLINICAL DATA:  79 year old male -ORIF right femur fracture.  EXAM: RIGHT FEMUR - 2 VIEW; DG C-ARM 61-120 MIN  COMPARISON:  07/31/2013  FINDINGS: Intraoperative spot films of the right femur are submitted postoperatively for interpretation.  Nail and screw fixation of an intertrochanteric right femur fracture identified. The fracture is in near-anatomic alignment and position.  No complicating features are identified.  IMPRESSION: Internal fixation of intertrochanteric right femur fracture.   Electronically Signed   By: Hassan Rowan M.D.   On: 07/31/2013 17:35    Microbiology: Recent Results (from the past 240 hour(s))  URINE CULTURE     Status: None   Collection Time    07/31/13 10:21 PM      Result Value Ref Range Status   Specimen Description URINE, RANDOM   Final   Special Requests NONE   Final   Culture  Setup Time     Final   Value: 08/01/2013 10:02     Performed at [facility]     Final   Value: NO GROWTH     Performed at [facility]   Culture     Final   Value: NO GROWTH     Performed at [facility]   Report Status 08/02/2013 FINAL   Final  CULTURE, BLOOD (ROUTINE X 2)     Status: None   Collection Time    08/02/13  3:00 PM      Result Value Ref Range Status   Specimen Description BLOOD RIGHT ARM   Final   Special Requests BOTTLES DRAWN AEROBIC AND ANAEROBIC 10CC   Final   Culture  Setup Time     Final   Value: 08/02/2013 19:06      Performed at [facility]   Culture     Final   Value: NO GROWTH 5 DAYS     Performed at [facility]   Report Status 08/08/2013 FINAL   Final  CULTURE, BLOOD (ROUTINE X 2)     Status: None   Collection Time    08/02/13  3:15 PM      Result Value Ref Range Status   Specimen Description BLOOD LEFT HAND   Final   Special Requests BOTTLES DRAWN AEROBIC AND ANAEROBIC 10CC   Final   Culture  Setup Time     Final   Value: 08/02/2013 19:07     Performed at [facility]   Culture     Final   Value: NO GROWTH 5 DAYS     Performed at [facility]   Report Status 08/08/2013 FINAL   Final  MRSA PCR SCREENING     Status: None   Collection Time    08/04/13  7:11 PM      Result Value Ref Range Status   MRSA by PCR NEGATIVE  NEGATIVE Final   Comment:  The GeneXpert MRSA Assay (FDA     approved for NASAL specimens     only), is one component of a     comprehensive MRSA colonization     surveillance program. It is not     intended to diagnose MRSA     infection nor to guide or     monitor treatment for     MRSA infections.     Labs: Basic Metabolic Panel:  Recent Labs Lab 08/02/13 0432 08/04/13 0638 08/06/13 0655 08/07/13 0245  NA 138 141 134*  --   K 4.1 3.8 4.6  --   CL 102 103 102  --   CO2 22 22 21   --   GLUCOSE 161* 100* 98  --   BUN 44* 42* 35*  --   CREATININE 2.07* 1.59* 1.52* 1.71*  CALCIUM 8.6 8.6 8.7  --    Liver Function Tests:  Recent Labs Lab 08/04/13 0638  AST 69*  ALT 32  ALKPHOS 55  BILITOT 0.9  PROT 6.6  ALBUMIN 2.6*   No results found for this basename: LIPASE, AMYLASE,  in the last 168 hours No results found for this basename: AMMONIA,  in the last 168 hours CBC:  Recent Labs Lab 08/02/13 0432 08/03/13 0440 08/04/13 0638 08/06/13 0655  WBC 13.3* 11.0* 9.4 8.5  HGB 7.5* 7.2* 8.7* 8.7*  HCT 21.2* 21.2* 25.7* 26.0*  MCV 88.7 90.6 87.1 89.0  PLT 241 233 276 342   Cardiac Enzymes: No results found  for this basename: CKTOTAL, CKMB, CKMBINDEX, TROPONINI,  in the last 168 hours BNP: BNP (last 3 results) No results found for this basename: PROBNP,  in the last 8760 hours CBG: No results found for this basename: GLUCAP,  in the last 168 hours     Signed:  Eulogio Bear  [facility] 08/08/2013, 10:52 AM

## 2013-08-08 NOTE — Progress Notes (Signed)
Pt report called to [facility] RN[facility]Joy.  Awaiting transport at this time.

## 2013-08-08 NOTE — Progress Notes (Signed)
Speech Language Pathology Treatment: Dysphagia  Patient Details Name: Walter Carey MRN: [id number] DOB: October 21, 1934 Today's Date: 08/08/2013 Time: 1017-1026 SLP Time Calculation (min): 9 min  Assessment / Plan / Recommendation Clinical Impression  Attempted trials of upgraded solid textures, with max hand over hand assist, pt with no attempt to masticate, pushed cracker out of his mouth. He did accept sips of water via straw without incident. Recommend pt continue a puree diet and thin liquids. SLP will f/u x1 later this week for any change.    HPI HPI: 79 year old male admitted 07/31/13 due to fall and hip fracture. PMH significant for dementia. BSE ordered to evaluate swallow function and safety and identify least restrictive diet.   Pertinent Vitals NA  SLP Plan  Continue with current plan of care    Recommendations Diet recommendations: Dysphagia 1 (puree);Thin liquid Liquids provided via: Cup;Straw Medication Administration: Whole meds with puree Supervision: Staff to assist with self feeding;Full supervision/cueing for compensatory strategies;Patient able to self feed Compensations: Slow rate;Small sips/bites;Follow solids with liquid Postural Changes and/or Swallow Maneuvers: Seated upright 90 degrees;Upright 30-60 min after meal              Oral Care Recommendations: Oral care BID Follow up Recommendations: Skilled Nursing facility;24 hour supervision/assistance Plan: Continue with current plan of care    GO    [facility], MA CCC-SLP [phone]  Lynann Beaver 08/08/2013, 10:26 AM

## 2013-08-08 NOTE — Clinical Social Work Note (Signed)
CSW has faxed discharge summary to [facility]. CSW has completed discharge packet and placed on pt's shadow chart. Pt's daughter, Versie Starks, has been notified of pt's discharge. Per pt's daughter, pt's spouse to arrive at [facility] to accompany pt via EMS ([facility]) at time of discharge.  CSW has arranged for EMS ([facility]) transportation at 2pm.  RN to please call for report at [place] [facility] [place], MSW, [facility] Licensed Clinical Social Worker [phone] and [phone]

## 2013-12-23 ENCOUNTER — Ambulatory Visit: Payer: Medicare Other | Admitting: Adult Health

## 2013-12-23 ENCOUNTER — Encounter: Payer: Self-pay | Admitting: Adult Health

## 2015-08-07 DEATH — deceased
